# Patient Record
Sex: Male | Born: 1945
Health system: Southern US, Community
[De-identification: ages and names within clinical notes are randomized; demographics above are authoritative.]

## PROBLEM LIST (undated history)

## (undated) DIAGNOSIS — M1990 Unspecified osteoarthritis, unspecified site: Secondary | ICD-10-CM

## (undated) DIAGNOSIS — E119 Type 2 diabetes mellitus without complications: Secondary | ICD-10-CM

## (undated) DIAGNOSIS — E785 Hyperlipidemia, unspecified: Secondary | ICD-10-CM

## (undated) DIAGNOSIS — F419 Anxiety disorder, unspecified: Secondary | ICD-10-CM

## (undated) DIAGNOSIS — K219 Gastro-esophageal reflux disease without esophagitis: Secondary | ICD-10-CM

## (undated) HISTORY — PX: CHOLECYSTECTOMY: SHX55

## (undated) HISTORY — PX: HYDROCELE EXCISION: SHX482

## (undated) HISTORY — DX: Hyperlipidemia, unspecified: E78.5

## (undated) HISTORY — DX: Anxiety disorder, unspecified: F41.9

## (undated) HISTORY — PX: SHOULDER SURGERY: SHX246

## (undated) HISTORY — DX: Gastro-esophageal reflux disease without esophagitis: K21.9

---

## 1998-02-12 ENCOUNTER — Other Ambulatory Visit: Admission: RE | Admit: 1998-02-12 | Discharge: 1998-02-12 | Payer: Self-pay | Admitting: Gastroenterology

## 1999-07-01 ENCOUNTER — Encounter: Payer: Self-pay | Admitting: Family Medicine

## 1999-07-01 ENCOUNTER — Encounter: Admission: RE | Admit: 1999-07-01 | Discharge: 1999-07-01 | Payer: Self-pay | Admitting: Family Medicine

## 2000-05-15 ENCOUNTER — Emergency Department (HOSPITAL_COMMUNITY): Admission: EM | Admit: 2000-05-15 | Discharge: 2000-05-15 | Payer: Self-pay | Admitting: Emergency Medicine

## 2002-04-29 ENCOUNTER — Encounter: Payer: Self-pay | Admitting: Emergency Medicine

## 2002-04-29 ENCOUNTER — Emergency Department (HOSPITAL_COMMUNITY): Admission: EM | Admit: 2002-04-29 | Discharge: 2002-04-29 | Payer: Self-pay | Admitting: Emergency Medicine

## 2002-10-19 ENCOUNTER — Encounter (HOSPITAL_BASED_OUTPATIENT_CLINIC_OR_DEPARTMENT_OTHER): Payer: Self-pay | Admitting: General Surgery

## 2002-10-23 ENCOUNTER — Encounter (INDEPENDENT_AMBULATORY_CARE_PROVIDER_SITE_OTHER): Payer: Self-pay | Admitting: Specialist

## 2002-10-23 ENCOUNTER — Ambulatory Visit (HOSPITAL_COMMUNITY): Admission: RE | Admit: 2002-10-23 | Discharge: 2002-10-24 | Payer: Self-pay | Admitting: General Surgery

## 2002-10-23 ENCOUNTER — Encounter (HOSPITAL_BASED_OUTPATIENT_CLINIC_OR_DEPARTMENT_OTHER): Payer: Self-pay | Admitting: General Surgery

## 2005-12-30 ENCOUNTER — Encounter: Admission: RE | Admit: 2005-12-30 | Discharge: 2005-12-30 | Payer: Self-pay | Admitting: Family Medicine

## 2006-07-13 HISTORY — PX: CHOLECYSTECTOMY: SHX55

## 2006-08-30 ENCOUNTER — Ambulatory Visit: Payer: Self-pay | Admitting: Gastroenterology

## 2006-09-13 ENCOUNTER — Ambulatory Visit: Payer: Self-pay | Admitting: Gastroenterology

## 2006-09-13 ENCOUNTER — Encounter: Payer: Self-pay | Admitting: Gastroenterology

## 2007-10-27 ENCOUNTER — Ambulatory Visit (HOSPITAL_BASED_OUTPATIENT_CLINIC_OR_DEPARTMENT_OTHER): Admission: RE | Admit: 2007-10-27 | Discharge: 2007-10-28 | Payer: Self-pay | Admitting: Orthopedic Surgery

## 2009-11-25 ENCOUNTER — Ambulatory Visit (HOSPITAL_BASED_OUTPATIENT_CLINIC_OR_DEPARTMENT_OTHER): Admission: RE | Admit: 2009-11-25 | Discharge: 2009-11-25 | Payer: Self-pay | Admitting: Urology

## 2010-09-29 LAB — POCT I-STAT 4, (NA,K, GLUC, HGB,HCT)
Glucose, Bld: 128 mg/dL — ABNORMAL HIGH (ref 70–99)
HCT: 45 % (ref 39.0–52.0)
Hemoglobin: 15.3 g/dL (ref 13.0–17.0)
Potassium: 5 mEq/L (ref 3.5–5.1)
Sodium: 140 mEq/L (ref 135–145)

## 2010-09-29 LAB — GLUCOSE, CAPILLARY: Glucose-Capillary: 111 mg/dL — ABNORMAL HIGH (ref 70–99)

## 2010-11-25 NOTE — Op Note (Signed)
NAMEJOVONTE, COMMINS NO.:  [id number]   MEDICAL RECORD NO.:  [id number]          PATIENT TYPE:  AMB   LOCATION:  DSC                          FACILITY:  [facility]   PHYSICIAN:  Loreta Ave, M.D. DATE OF BIRTH:  Feb 25, 1946   DATE OF PROCEDURE:  10/27/2007  DATE OF DISCHARGE:                               OPERATIVE REPORT   PREOPERATIVE DIAGNOSES:  1. Right shoulder large retracted rotator cuff tear.  2. Impingement.  3. Labral tear.  4. Degenerative joint disease, acromioclavicular joint.   POSTOPERATIVE DIAGNOSES:  1. Right shoulder large retracted rotator cuff tear.  2. Impingement.  3. Labral tear.  4. Degenerative joint disease, acromioclavicular joint.  5. Extensive retracted supra and infraspinatus tear with interval      tearing.  6. Loose body partially tethered to torn cuff.   PROCEDURE:  1. Right shoulder exam under anesthesia.  2. Arthroscopy with debridement of glenohumeral joint including      labrum, loose body, biceps tendon cuff.  3. Bursectomy, acromioplasty, and coracoacromial ligament release.  4. Excision, distal clavicle.  Mini open rotator cuff repair with      FiberWire suture, Concept repair system and closure of interval      tear with FiberWire.   SURGEON:  Loreta Ave, MD.   ASSISTANT:  Genene Churn. Barry Dienes, [place], present throughout the entire case  necessary for timely completion of procedure.   ANESTHESIA:  General.   BLOOD LOSS:  Minimal.   SPECIMENS:  None.   CULTURES:  None.   COMPLICATIONS:  None.   DRESSING:  Soft compression with shoulder immobilizer.   PROCEDURE:  The patient was brought to the operating room and placed on  the operating table in supine position.  After adequate anesthesia had  been obtained, right shoulder was examined.  Moderate stiffness, but  full motion, no adhesions.  Placed in a beach chair position on the  shoulder positioner, prepped and draped in usual sterile fashion.  Three  portals; anterior, posterior, and lateral.  Shoulder entered with blunt  obturator, arthroscope introduced, shoulder distended and inspected.  Some grade 2 changes in the shoulder debrided.  Extensive  circumferential labral tears debrided.  Biceps tendon, biceps anchor  with some tendinosis, but intact and functional.  Debrided.  Cuff,  massive tear, entire supraspinatus, half of the infraspinatus with  interval tearing retracted, but still mobile.  Loose body partially at  the shoulder, partially tethered to the cuff, removed.  Cuff debrided  and assessed and felt to be  ruffled from above, type 3 acromion  impingement.  Acromioplasty to a type 1 acromion with shaver and high-  speed burr releasing CA ligament and cautery.  Distal clavicle, grade 4  changes with spurs.  We took out the spurs and lateral centimeter of  clavicle resected.  Adequacy of decompression confirmed viewing from all  portals.  Instruments and fluid removed.  Deltoid splitting incision  through the lateral portal.  Subacromial space accessed.  Adequacy of  acromioplasty confirmed.  Wound irrigated.  Cuff mobilized.  Tuberosity  brought down to bleeding bone  with a bur.  Cuff was then captured with  weave two FiberWire sutures, anterior of which was used to do a running  closure of the interval tear.  With that, the cuff was out laterally  fairly easily.  Series of drill holes in the tuberosity brought out  laterally with the Concept repair system and the sutures viewed through  these.  Arm was abducted, sutures tied over bony bridge.  Nice, firm,  watertight closure of the cuff without undue tension even with full  passive motion.  Wound irrigated.  Deltoid closed with Vicryl, skin with  subcutaneous subcuticular Vicryl, and portals with nylon.  Sterile  compressive dressing applied.  Shoulder immobilizer applied.  Anesthesia  reversed.  Brought to recovery room.  Tolerated the surgery well.  No   complications.      Loreta Ave, M.D.  Electronically Signed     DFM/MEDQ  D:  10/27/2007  T:  10/28/2007  Job:  [id number]

## 2010-11-28 NOTE — Op Note (Signed)
NAME:  Luke Alvarez, FULL NO.:  [id number]   MEDICAL RECORD NO.:  [id number]                   PATIENT TYPE:  OIB   LOCATION:  2550                                 FACILITY:  [facility]   PHYSICIAN:  Leonie Man, M.D.                DATE OF BIRTH:  05/29/1946   DATE OF PROCEDURE:  10/23/2002  DATE OF DISCHARGE:                                 OPERATIVE REPORT   PREOPERATIVE DIAGNOSIS:  Chronic calculous cholecystitis.   POSTOPERATIVE DIAGNOSIS:  Chronic calculous cholecystitis.   PROCEDURE:  Laparoscopic cholecystectomy, intraoperative cholangiogram.   SURGEON:  Leonie Man, M.D.   ASSISTANT:  Joanne Gavel, M.D.   ANESTHESIA:  General.   CLINICAL NOTE:  The patient is a 65 year old man presenting with symptomatic  cholelithiasis, having had some epigastric pain, nausea or vomiting, two  severe attacks, causing his evaluation in the ER on two separate occasions.  He has some bloating after meals, no increased flatulence.  Gallbladder  ultrasound shows a large solitary gallstone on 09/12/02.  LFTs and amylase are  within normal limits.  The patient comes to the operating room now after the  risks, potential benefits, as well as the indications for surgery have been  fully discussed, all questions answered, and consent obtained.   DESCRIPTION OF PROCEDURE:  Following the induction of satisfactory general  anesthesia, the patient is positioned supinely.  The abdomen is routinely  prepped and draped to be included in a sterile operative field.  Open  laparoscopy is created at the umbilicus with the correction and preparation  of an umbilical hernia, which was then dissected free from the surrounding  soft tissues with the sac and hernial contents being resected and forwarded  for pathologic evaluation.  The Hasson cannula was put in place and  insufflation of the peritoneal cavity was carried out to 14-15 mmHg pressure  using carbon dioxide.  The camera  is inserted, visual exploration of the  abdomen is carried out.  The patient has a capacious abdomen with rather  large amounts of omental fat.  None of the large or small intestine viewed  appeared to be abnormal.  The gallbladder was chronically scarred, liver  edges were sharp, liver surfaces smooth.  Anterior gastric wall and duodenal  sweep appeared to be normal.  Under direct vision epigastric and lateral  ports were placed.  The gallbladder was grasped and retracted cephalad.  The  patient was placed in the reverse Trendelenburg position.  Dissection was  then carried down to the region of the ampulla with isolation of the cystic  artery and the cystic duct.  The cystic artery was triply clipped and  transected, and the cystic duct was clipped proximally and opened.  A cystic  duct cholangiogram was carried out by passing a Reddick catheter into the  abdomen through a 14-gauge Angiocath and then inserting the Reddick catheter  into the cystic  duct.  A fluoroscopic cholangiogram was carried out with one-  half strength Hypaque.  The resulting cholangiogram showed free flow of  contrast to the duodenum, normal tapering of the distal common bile duct  with flow of contrast into the upper hepatic radicles.  There were three  filling defects within the common bile duct, which appeared to me to be  bubbles.   At the end of the cholangiogram, the catheter was removed and the cystic  duct was triply clamped distally and transected.  The gallbladder was then  dissected free from the liver bed using electrocautery and maintaining  hemostasis throughout the course of the dissection.  A small perforation was  made in the gallbladder during this course, and some bile was poured out.  This was easily sucked up and the right upper quadrant irrigated.  The  camera was then placed in the epigastric port after the gallbladder had been  placed in an EndoCatch.  The EndoCatch bag was retrieved through  the  umbilicus without difficulty.  Sponge, instrument, and sharp counts were  then verified and the pneumoperitoneum deflated and the wounds closed in  layers as follows after the trocars had been removed from the abdomen.  The  umbilical hernia was then repaired primarily with interrupted sutures of 0  Novofil.  The umbilical skin was then closed with a running suture of 4-0  Monocryl.  The flank wounds and the epigastric wound were closed with 4-0  Monocryl sutures, and all wounds were then reinforced with Steri-Strips.  Sterile dressings were applied, the anesthetic reversed, and the patient  removed from the operating room to the recovery room in stable condition.  He tolerated the procedure well.                                               Leonie Man, M.D.    PB/MEDQ  D:  10/23/2002  T:  10/23/2002  Job:  [id number]

## 2011-04-07 LAB — BASIC METABOLIC PANEL
CO2: 28
Chloride: 105
GFR calc Af Amer: >60
Sodium: 142

## 2011-04-07 LAB — POCT HEMOGLOBIN-HEMACUE: Hemoglobin: 14

## 2014-10-04 DIAGNOSIS — N183 Chronic kidney disease, stage 3 (moderate): Secondary | ICD-10-CM | POA: Diagnosis not present

## 2014-10-04 DIAGNOSIS — N529 Male erectile dysfunction, unspecified: Secondary | ICD-10-CM | POA: Diagnosis not present

## 2014-10-04 DIAGNOSIS — E119 Type 2 diabetes mellitus without complications: Secondary | ICD-10-CM | POA: Diagnosis not present

## 2014-10-04 DIAGNOSIS — Z23 Encounter for immunization: Secondary | ICD-10-CM | POA: Diagnosis not present

## 2014-10-04 DIAGNOSIS — F411 Generalized anxiety disorder: Secondary | ICD-10-CM | POA: Diagnosis not present

## 2014-10-04 DIAGNOSIS — E780 Pure hypercholesterolemia: Secondary | ICD-10-CM | POA: Diagnosis not present

## 2014-10-04 DIAGNOSIS — R030 Elevated blood-pressure reading, without diagnosis of hypertension: Secondary | ICD-10-CM | POA: Diagnosis not present

## 2015-01-10 DIAGNOSIS — E119 Type 2 diabetes mellitus without complications: Secondary | ICD-10-CM | POA: Diagnosis not present

## 2015-04-08 DIAGNOSIS — E119 Type 2 diabetes mellitus without complications: Secondary | ICD-10-CM | POA: Diagnosis not present

## 2015-04-08 DIAGNOSIS — F411 Generalized anxiety disorder: Secondary | ICD-10-CM | POA: Diagnosis not present

## 2015-04-08 DIAGNOSIS — Z23 Encounter for immunization: Secondary | ICD-10-CM | POA: Diagnosis not present

## 2015-04-08 DIAGNOSIS — N183 Chronic kidney disease, stage 3 (moderate): Secondary | ICD-10-CM | POA: Diagnosis not present

## 2015-04-08 DIAGNOSIS — R030 Elevated blood-pressure reading, without diagnosis of hypertension: Secondary | ICD-10-CM | POA: Diagnosis not present

## 2015-04-08 DIAGNOSIS — Z1389 Encounter for screening for other disorder: Secondary | ICD-10-CM | POA: Diagnosis not present

## 2015-04-08 DIAGNOSIS — N529 Male erectile dysfunction, unspecified: Secondary | ICD-10-CM | POA: Diagnosis not present

## 2015-04-08 DIAGNOSIS — E780 Pure hypercholesterolemia: Secondary | ICD-10-CM | POA: Diagnosis not present

## 2015-05-06 ENCOUNTER — Encounter (HOSPITAL_COMMUNITY): Payer: Self-pay | Admitting: *Deleted

## 2015-05-06 ENCOUNTER — Emergency Department (INDEPENDENT_AMBULATORY_CARE_PROVIDER_SITE_OTHER)
Admission: EM | Admit: 2015-05-06 | Discharge: 2015-05-06 | Disposition: A | Discharge status: Home / Self Care | Payer: Self-pay | Source: Home / Self Care | Attending: Family Medicine | Admitting: Family Medicine

## 2015-05-06 ENCOUNTER — Emergency Department (INDEPENDENT_AMBULATORY_CARE_PROVIDER_SITE_OTHER): Payer: Self-pay

## 2015-05-06 DIAGNOSIS — S61012A Laceration without foreign body of left thumb without damage to nail, initial encounter: Secondary | ICD-10-CM

## 2015-05-06 HISTORY — DX: Type 2 diabetes mellitus without complications: E11.9
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
## 2015-05-06 NOTE — Discharge Instructions (Signed)
Leave bandaged until wed, then clean and bandage daily until healed. Return as needed.

## 2015-08-06 DIAGNOSIS — R972 Elevated prostate specific antigen [PSA]: Secondary | ICD-10-CM | POA: Diagnosis not present

## 2015-09-10 ENCOUNTER — Encounter (HOSPITAL_COMMUNITY): Payer: Self-pay | Admitting: *Deleted

## 2015-09-10 ENCOUNTER — Emergency Department (HOSPITAL_COMMUNITY)
Admission: EM | Admit: 2015-09-10 | Discharge: 2015-09-10 | Disposition: A | Discharge status: Home / Self Care | Payer: Commercial Managed Care - HMO | Attending: Emergency Medicine | Admitting: Emergency Medicine

## 2015-09-10 DIAGNOSIS — Z7984 Long term (current) use of oral hypoglycemic drugs: Secondary | ICD-10-CM | POA: Diagnosis not present

## 2015-09-10 DIAGNOSIS — E860 Dehydration: Secondary | ICD-10-CM | POA: Diagnosis not present

## 2015-09-10 DIAGNOSIS — R55 Syncope and collapse: Secondary | ICD-10-CM | POA: Diagnosis not present

## 2015-09-10 DIAGNOSIS — E119 Type 2 diabetes mellitus without complications: Secondary | ICD-10-CM | POA: Insufficient documentation

## 2015-09-10 DIAGNOSIS — R112 Nausea with vomiting, unspecified: Secondary | ICD-10-CM | POA: Insufficient documentation

## 2015-09-10 DIAGNOSIS — Z7982 Long term (current) use of aspirin: Secondary | ICD-10-CM | POA: Diagnosis not present

## 2015-09-10 DIAGNOSIS — Z79899 Other long term (current) drug therapy: Secondary | ICD-10-CM | POA: Diagnosis not present

## 2015-09-10 DIAGNOSIS — R404 Transient alteration of awareness: Secondary | ICD-10-CM | POA: Diagnosis not present

## 2015-09-10 LAB — URINE MICROSCOPIC-ADD ON
BACTERIA UA: NONE SEEN
RBC / HPF: NONE SEEN RBC/hpf (ref 0–5)
SQUAMOUS EPITHELIAL / LPF: NONE SEEN
WBC, UA: NONE SEEN WBC/hpf (ref 0–5)

## 2015-09-10 LAB — BASIC METABOLIC PANEL
Anion gap: 10 (ref 5–15)
BUN: 21 mg/dL — AB (ref 6–20)
CHLORIDE: 107 mmol/L (ref 101–111)
CO2: 22 mmol/L (ref 22–32)
CREATININE: 1.48 mg/dL — AB (ref 0.61–1.24)
Calcium: 8.5 mg/dL — ABNORMAL LOW (ref 8.9–10.3)
GFR calc Af Amer: 54 mL/min — ABNORMAL LOW (ref >60)
GFR calc non Af Amer: 47 mL/min — ABNORMAL LOW (ref >60)
Glucose, Bld: 155 mg/dL — ABNORMAL HIGH (ref 65–99)
Potassium: 4.3 mmol/L (ref 3.5–5.1)
SODIUM: 139 mmol/L (ref 135–145)

## 2015-09-10 LAB — CBC
HCT: 49.4 % (ref 39.0–52.0)
Hemoglobin: 16.3 g/dL (ref 13.0–17.0)
MCH: 29.5 pg (ref 26.0–34.0)
MCHC: 33 g/dL (ref 30.0–36.0)
MCV: 89.3 fL (ref 78.0–100.0)
Platelets: 170 10*3/uL (ref 150–400)
RBC: 5.53 MIL/uL (ref 4.22–5.81)
RDW: 13.4 % (ref 11.5–15.5)
WBC: 9 10*3/uL (ref 4.0–10.5)

## 2015-09-10 LAB — I-STAT TROPONIN, ED: TROPONIN I, POC: 0.01 ng/mL (ref 0.00–0.08)

## 2015-09-10 LAB — URINALYSIS, ROUTINE W REFLEX MICROSCOPIC
BILIRUBIN URINE: NEGATIVE
Glucose, UA: >1000 mg/dL — AB
HGB URINE DIPSTICK: NEGATIVE
KETONES UR: NEGATIVE mg/dL
Leukocytes, UA: NEGATIVE
NITRITE: NEGATIVE
Protein, ur: NEGATIVE mg/dL
SPECIFIC GRAVITY, URINE: 1.038 — AB (ref 1.005–1.030)
pH: 6 (ref 5.0–8.0)

## 2015-09-10 LAB — CBG MONITORING, ED: Glucose-Capillary: 136 mg/dL — ABNORMAL HIGH (ref 65–99)

## 2015-09-10 MED ORDER — SODIUM CHLORIDE 0.9 % IV BOLUS (SEPSIS)
1000.0000 mL | Freq: Once | INTRAVENOUS | Status: AC
  Administered 2015-09-10: 1000 mL via INTRAVENOUS

## 2015-09-10 MED ORDER — ONDANSETRON 4 MG PO TBDP: 4.0000 mg | ORAL_TABLET | Freq: Three times a day (TID) | ORAL | Status: DC | PRN

## 2015-09-10 NOTE — ED Provider Notes (Signed)
CSN: [id number]     Arrival date & time 09/10/15  1206 History   First MD Initiated Contact with Patient 09/10/15 1413     Chief Complaint  Patient presents with  . Loss of Consciousness  . Weakness  . Emesis     HPI   Luke Alvarez is an 70 y.o. male with history of DM who presents to the ED for evaluation of N/V and syncope. Pt reports this morning around 3 AM he woke up feeling very nauseated and had several consecutive episodes of NBNB emesis. Denies hematemsis. States initially he did have some sharp cramping abdominal pain but states felt queasy and bloated. States in the ED now his nausea has resolved. Denies diarrhea. He states all morning he continued to feel weak and run-down. States around 11 AM he was standing in the bathroom shaving when he suddenly got lightheaded. He states he went to sit down but on his way felt like he was going to pass out. He states he remembers slumping against the wall, sliding down, and falling to the floor. He states he thinks he was "out" for a few seconds and then regained consciousness on his own. Denies chest pain, SOB, diaphoresis. States in the ED now he feels tired and weak, though the nausea has passed. Denies any pain at this time. He states he does not think he was in any pain at the time of his syncopal episode. States he has never passed out before.   Past Medical History  Diagnosis Date  . Diabetes mellitus without complication [facility])    Past Surgical History  Procedure Laterality Date  . Cholecystectomy    . Shoulder surgery     No family history on file. Social History  Substance Use Topics  . Smoking status: Never Smoker   . Smokeless tobacco: None  . Alcohol Use: Yes    Review of Systems  All other systems reviewed and are negative.     Allergies  Review of patient's allergies indicates no known allergies.  Home Medications   Prior to Admission medications   Medication Sig Start Date End Date Taking? Authorizing Provider   aspirin EC 81 MG tablet Take 81 mg by mouth daily.   Yes Historical Provider, MD  canagliflozin (INVOKANA) 100 MG TABS tablet Take 100 mg by mouth daily before breakfast.   Yes Historical Provider, MD  glimepiride (AMARYL) 2 MG tablet Take 2 mg by mouth daily with breakfast.   Yes Historical Provider, MD  Multiple Vitamin (MULTIVITAMIN WITH MINERALS) TABS tablet Take 1 tablet by mouth daily.   Yes Historical Provider, MD  naproxen sodium (ANAPROX) 220 MG tablet Take 220 mg by mouth every 12 (twelve) hours as needed (for pain).   Yes Historical Provider, MD  Nutritional Supplements (L-GLUTAMINE/CHOLINE/INOSITOL PO) Take 1 tablet by mouth daily.   Yes Historical Provider, MD  Omega-3 Fatty Acids (FISH OIL) 1000 MG CAPS Take 1 capsule by mouth 2 (two) times daily.   Yes Historical Provider, MD  omeprazole (PRILOSEC) 20 MG capsule Take 20 mg by mouth daily.   Yes Historical Provider, MD  pioglitazone (ACTOS) 30 MG tablet Take 30 mg by mouth daily.   Yes Historical Provider, MD  simvastatin (ZOCOR) 40 MG tablet Take 40 mg by mouth daily.   Yes Historical Provider, MD   BP 122/85 mmHg  Pulse 99  Temp(Src) 98.5 F (36.9 C) (Oral)  Resp 20  SpO2 94% Physical Exam  Constitutional: He is oriented to person, place,  and time.  HENT:  Right Ear: External ear normal.  Left Ear: External ear normal.  Nose: Nose normal.  Mouth/Throat: Oropharynx is clear and moist. Mucous membranes are dry. No oropharyngeal exudate.  Eyes: Conjunctivae and EOM are normal. Pupils are equal, round, and reactive to light.  Neck: Normal range of motion. Neck supple.  Cardiovascular: Normal rate, regular rhythm, normal heart sounds and intact distal pulses.   Pulmonary/Chest: Effort normal and breath sounds normal. No respiratory distress. He has no wheezes. He exhibits no tenderness.  Abdominal: Soft. Bowel sounds are normal. He exhibits no distension. There is no tenderness. There is no rebound and no guarding.   Musculoskeletal: He exhibits no edema.  Neurological: He is alert and oriented to person, place, and time. He has normal strength. No cranial nerve deficit or sensory deficit. Coordination and gait normal.  Skin: Skin is warm and dry.  Psychiatric: He has a normal mood and affect.  Nursing note and vitals reviewed.  Filed Vitals:   09/10/15 1236 09/10/15 1440 09/10/15 1518 09/10/15 1613  BP:  124/92 121/76 131/84  Pulse:  99 99 97  Temp:      TempSrc:      Resp:  18 20 18   SpO2: 94% 92% 92% 94%   Orthostatic VS for the past 24 hrs:  BP- Lying Pulse- Lying BP- Sitting Pulse- Sitting BP- Standing at 0 minutes Pulse- Standing at 0 minutes  09/10/15 1409 122/80 mmHg 98 102/72 mmHg 101 121/69 mmHg 102       ED Course  Procedures (including critical care time) Labs Review Labs Reviewed  BASIC METABOLIC PANEL - Abnormal; Notable for the following:    Glucose, Bld 155 (*)    BUN 21 (*)    Creatinine, Ser 1.48 (*)    Calcium 8.5 (*)    GFR calc non Af Amer 47 (*)    GFR calc Af Amer 54 (*)    All other components within normal limits  URINALYSIS, ROUTINE W REFLEX MICROSCOPIC (NOT AT [facility]) - Abnormal; Notable for the following:    Specific Gravity, Urine 1.038 (*)    Glucose, UA >1000 (*)    All other components within normal limits  CBG MONITORING, ED - Abnormal; Notable for the following:    Glucose-Capillary 136 (*)    All other components within normal limits  CBC  URINE MICROSCOPIC-ADD ON  I-STAT TROPOININ, ED    Imaging Review No results found. I have personally reviewed and evaluated these images and lab results as part of my medical decision-making.   EKG Interpretation   Date/Time:  Tuesday September 10 2015 12:16:05 EST Ventricular Rate:  99 PR Interval:  164 QRS Duration: 90 QT Interval:  342 QTC Calculation: 439 R Axis:   -103 Text Interpretation:  Sinus rhythm Left anterior fascicular block RSR' in  V1 or V2, right VCD or RVH Confirmed by COOK  MD,  BRIAN ([id number]) on  09/10/2015 3:09:08 PM      MDM   Final diagnoses:  Dehydration  Syncope, unspecified syncope type     Pt is an 70 y.o. male with history of DM who presents for evaluation following syncopal episode this AM. I suspect secondary to dehydration and possibly orthostasis. Pt woke this AM with N/V which has since resolved. Suspect viral etiology. Abdominal exam benign with no peritoneal signs. Pt is clinically dry. Urine specific gravity 1.038, BUN 21, Cr 1.48. Trop negative. EKG unchanged from prior. Pt feeling improved after first liter of  IV fluids. Pt seen and evaluated by attending MD Lacinda Axon as well. Pt has ambulated and remained asymptomatic and maintained SpO2 95%. Will give one additional liter of fluids and discharge. Resource guide given for PCP f/u. ER return precautions given.   Anne Ng, PA-C 09/10/15 1625  Nat Christen, MD 09/11/15 [phone]

## 2015-09-10 NOTE — ED Notes (Signed)
Bed: [id number] Expected date:  Expected time:  Means of arrival:  Comments: EMS SYNCOPE

## 2015-09-10 NOTE — Discharge Instructions (Signed)
You were seen in the ER today for evaluation after a syncopal episode (fainting). Your labs and exam suggest dehydration, which I suspect is why you fainted earlier today. Your symptoms improved with IV fluids in the emergency room. Drink plenty of water to stay hydrated. I will give you a prescription for nausea medication to take as needed. I suspect you have a viral GI bug that caused your nausea and vomiting earlier today.  Take medications as prescribed. Return to the emergency room for worsening condition or new concerning symptoms. Follow up with your regular doctor. If you don't have a regular doctor use one of the numbers below to establish a primary care doctor.   Emergency Department Resource Guide 1) Find a Doctor and Pay Out of Pocket Although you won't have to find out who is covered by your insurance plan, it is a good idea to ask around and get recommendations. You will then need to call the office and see if the doctor you have chosen will accept you as a new patient and what types of options they offer for patients who are self-pay. Some doctors offer discounts or will set up payment plans for their patients who do not have insurance, but you will need to ask so you aren't surprised when you get to your appointment.  2) Contact Your Local Health Department Not all health departments have doctors that can see patients for sick visits, but many do, so it is worth a call to see if yours does. If you don't know where your local health department is, you can check in your phone book. The CDC also has a tool to help you locate your state's health department, and many state websites also have listings of all of their local health departments.  3) Find a [facility] If your illness is not likely to be very severe or complicated, you may want to try a walk in clinic. These are popping up all over the country in pharmacies, drugstores, and shopping centers. They're usually staffed by nurse  practitioners or physician assistants that have been trained to treat common illnesses and complaints. They're usually fairly quick and inexpensive. However, if you have serious medical issues or chronic medical problems, these are probably not your best option.  No Primary Care Doctor: - Call Health Connect at  [phone] - they can help you locate a primary care doctor that  accepts your insurance, provides certain services, etc. - Physician Referral [phone]  Emergency Department Resource Guide 1) Find a Doctor and Pay Out of Pocket Although you won't have to find out who is covered by your insurance plan, it is a good idea to ask around and get recommendations. You will then need to call the office and see if the doctor you have chosen will accept you as a new patient and what types of options they offer for patients who are self-pay. Some doctors offer discounts or will set up payment plans for their patients who do not have insurance, but you will need to ask so you aren't surprised when you get to your appointment.  2) Contact Your Local Health Department Not all health departments have doctors that can see patients for sick visits, but many do, so it is worth a call to see if yours does. If you don't know where your local health department is, you can check in your phone book. The CDC also has a tool to help you locate your state's health department, and  many state websites also have listings of all of their local health departments.  3) Find a [facility] If your illness is not likely to be very severe or complicated, you may want to try a walk in clinic. These are popping up all over the country in pharmacies, drugstores, and shopping centers. They're usually staffed by nurse practitioners or physician assistants that have been trained to treat common illnesses and complaints. They're usually fairly quick and inexpensive. However, if you have serious medical issues or chronic  medical problems, these are probably not your best option.  No Primary Care Doctor: - Call Health Connect at  [phone] - they can help you locate a primary care doctor that  accepts your insurance, provides certain services, etc. - Physician Referral Service- [phone]  Chronic Pain Problems: Organization         Address  Phone   Notes  [facility]  [phone] Patients need to be referred by their primary care doctor.   Medication Assistance: Organization         Address  Phone   Notes  [facility] Medication [facility] [address] [place], [place] [address] [phone] --Must be a resident of [facility] -- Must have NO insurance coverage whatsoever (no Medicaid/ Medicare, etc.) -- The pt. MUST have a primary care doctor that directs their care regularly and follows them in the community   [facility]  [phone]   [employer]  [phone]    Agencies that provide inexpensive medical care: Organization         Address  Phone   Notes  [facility]  [phone]   [facility]    [phone]   [facility] [place],  [phone]   [place] [address], [place] [phone]   [facility]    [phone]   [facility]    [phone]   [place] and [place] [address], [place] Phone:  [phone], Fax:  [phone] Hours of Operation:  9 am - 6 pm, M-F.  Also accepts Medicaid/Medicare and self-pay.  [facility] for [place] [address], [address], [place] Phone: [phone], Fax: [phone]. Hours of Operation:  8:30 am - 5:30 pm, M-F.  Also accepts Medicaid and self-pay.  [facility] [place] [address] [place] Phone: [phone]   [place], [place], [place] [phone],  Ext. [phone] Mondays & Thursdays: 7-9 AM.  First 15 patients are seen on a first come, first serve basis.    [place] Providers:  Organization         Address  Phone   Notes  [facility] [address], [place] Ramirez [phone] Also accepts self-pay patients.  [facility] [id number], [place]  [phone]   [place], [address], [place] [phone]   [facility] [address] [place] [phone]   Lucianne Lei [address], [place]   [phone] Only accepts [place] patients after they have their name applied to their card.   Self-Pay (no insurance) in [facility]:  Organization  Address  Phone   Notes  Sickle Cell Patients, [facility] [place] [phone]   [facility] [place] [phone]   [facility] [place]  [place], [address], [place] [phone]   [facility]/Dr. Osei-Bonsu  [address] [place] or [address], [place] [phone] Phone number for both [place] and [place] locations is the same.  [facility] and [facility] [address], [place] [phone]   [facility] [address] [place] or [address] [phone]   [facility] [address], [place] [phone], phone; [phone], fax Sees patients 1st and 3rd Saturday of every month.  Must not qualify for public or private insurance (i.e. Medicaid, Medicare, [place] Health Choice, Veterans' Benefits)  Household income should be no more than 200% of the poverty level The clinic cannot treat you if you are pregnant or think you are pregnant  Sexually transmitted diseases are not  treated at the clinic.

## 2015-09-10 NOTE — ED Provider Notes (Signed)
4:26 PM Patient signed out to me by Sam, PA-C.  Plan is for patient to finish fluids and be discharge.  Patient was seen by Dr. Lacinda Axon as well, who advised discharge after fluids.  4:45 PM Patient reassessed by me.  States that he is feeling well and is ready to go home.  No complaints.  Montine Circle, PA-C 09/10/15 1646  Charlesetta Shanks, MD 09/22/15 1451

## 2015-09-10 NOTE — ED Notes (Signed)
Per EMS, pt had syncopal episode at ~11AM today. Wife heard patient lose consciousness, called EMS. Pt states he vomited last night, complains of weakness since this morning. Pt was alert and oriented upon EMS arrival. Pt alert and oriented to person, place, time. Pt given 4mg  zofran en route to hospital. CBG 167 on scene.

## 2015-09-20 DIAGNOSIS — N433 Hydrocele, unspecified: Secondary | ICD-10-CM | POA: Diagnosis not present

## 2015-09-20 DIAGNOSIS — Z0000 Encounter for general adult medical examination without abnormal findings: Secondary | ICD-10-CM | POA: Diagnosis not present

## 2015-09-20 DIAGNOSIS — R972 Elevated prostate specific antigen [PSA]: Secondary | ICD-10-CM | POA: Diagnosis not present

## 2015-10-08 DIAGNOSIS — Z7984 Long term (current) use of oral hypoglycemic drugs: Secondary | ICD-10-CM | POA: Diagnosis not present

## 2015-10-08 DIAGNOSIS — R972 Elevated prostate specific antigen [PSA]: Secondary | ICD-10-CM | POA: Diagnosis not present

## 2015-10-08 DIAGNOSIS — E7800 Pure hypercholesterolemia, unspecified: Secondary | ICD-10-CM | POA: Diagnosis not present

## 2015-10-08 DIAGNOSIS — Z86010 Personal history of colonic polyps: Secondary | ICD-10-CM | POA: Diagnosis not present

## 2015-10-08 DIAGNOSIS — E119 Type 2 diabetes mellitus without complications: Secondary | ICD-10-CM | POA: Diagnosis not present

## 2015-10-08 DIAGNOSIS — R030 Elevated blood-pressure reading, without diagnosis of hypertension: Secondary | ICD-10-CM | POA: Diagnosis not present

## 2015-10-08 DIAGNOSIS — N183 Chronic kidney disease, stage 3 (moderate): Secondary | ICD-10-CM | POA: Diagnosis not present

## 2015-10-08 DIAGNOSIS — N529 Male erectile dysfunction, unspecified: Secondary | ICD-10-CM | POA: Diagnosis not present

## 2015-11-11 ENCOUNTER — Other Ambulatory Visit: Payer: Self-pay | Admitting: Family Medicine

## 2015-11-11 ENCOUNTER — Ambulatory Visit
Admission: RE | Admit: 2015-11-11 | Discharge: 2015-11-11 | Disposition: A | Discharge status: Home / Self Care | Payer: Commercial Managed Care - HMO | Source: Ambulatory Visit | Attending: Family Medicine | Admitting: Family Medicine

## 2015-11-11 DIAGNOSIS — B349 Viral infection, unspecified: Secondary | ICD-10-CM | POA: Diagnosis not present

## 2015-11-11 DIAGNOSIS — R059 Cough, unspecified: Secondary | ICD-10-CM

## 2015-11-11 DIAGNOSIS — R05 Cough: Secondary | ICD-10-CM

## 2016-01-09 DIAGNOSIS — E1165 Type 2 diabetes mellitus with hyperglycemia: Secondary | ICD-10-CM | POA: Diagnosis not present

## 2016-01-09 DIAGNOSIS — Z7984 Long term (current) use of oral hypoglycemic drugs: Secondary | ICD-10-CM | POA: Diagnosis not present

## 2016-01-17 DIAGNOSIS — Z7984 Long term (current) use of oral hypoglycemic drugs: Secondary | ICD-10-CM | POA: Diagnosis not present

## 2016-01-17 DIAGNOSIS — E1165 Type 2 diabetes mellitus with hyperglycemia: Secondary | ICD-10-CM | POA: Diagnosis not present

## 2016-04-20 DIAGNOSIS — Z7984 Long term (current) use of oral hypoglycemic drugs: Secondary | ICD-10-CM | POA: Diagnosis not present

## 2016-04-20 DIAGNOSIS — E1165 Type 2 diabetes mellitus with hyperglycemia: Secondary | ICD-10-CM | POA: Diagnosis not present

## 2016-06-17 DIAGNOSIS — Z0000 Encounter for general adult medical examination without abnormal findings: Secondary | ICD-10-CM | POA: Diagnosis not present

## 2016-06-17 DIAGNOSIS — Z86010 Personal history of colonic polyps: Secondary | ICD-10-CM | POA: Diagnosis not present

## 2016-06-17 DIAGNOSIS — E119 Type 2 diabetes mellitus without complications: Secondary | ICD-10-CM | POA: Diagnosis not present

## 2016-06-17 DIAGNOSIS — N529 Male erectile dysfunction, unspecified: Secondary | ICD-10-CM | POA: Diagnosis not present

## 2016-06-17 DIAGNOSIS — E7800 Pure hypercholesterolemia, unspecified: Secondary | ICD-10-CM | POA: Diagnosis not present

## 2016-06-17 DIAGNOSIS — N183 Chronic kidney disease, stage 3 (moderate): Secondary | ICD-10-CM | POA: Diagnosis not present

## 2016-06-17 DIAGNOSIS — R030 Elevated blood-pressure reading, without diagnosis of hypertension: Secondary | ICD-10-CM | POA: Diagnosis not present

## 2016-07-13 HISTORY — PX: COLONOSCOPY: SHX174

## 2016-08-03 ENCOUNTER — Encounter: Payer: Self-pay | Admitting: Gastroenterology

## 2016-08-17 ENCOUNTER — Encounter: Payer: Self-pay | Admitting: Gastroenterology

## 2016-10-05 ENCOUNTER — Ambulatory Visit (AMBULATORY_SURGERY_CENTER): Payer: Self-pay | Admitting: *Deleted

## 2016-10-05 VITALS — Ht 67.0 in | Wt 219.0 lb

## 2016-10-05 DIAGNOSIS — Z86010 Personal history of colonic polyps: Secondary | ICD-10-CM

## 2016-10-05 MED ORDER — NA SULFATE-K SULFATE-MG SULF 17.5-3.13-1.6 GM/177ML PO SOLN: ORAL | 0 refills | Status: DC

## 2016-10-05 NOTE — Progress Notes (Signed)
Patient denies any allergies to eggs or soy. Patient denies any problems with anesthesia/sedation. Patient denies any oxygen use at home and does not take any diet/weight loss medications.  

## 2016-10-08 ENCOUNTER — Encounter: Payer: Self-pay | Admitting: Gastroenterology

## 2016-10-21 ENCOUNTER — Encounter: Payer: Self-pay | Admitting: Gastroenterology

## 2016-10-21 ENCOUNTER — Ambulatory Visit (AMBULATORY_SURGERY_CENTER): Payer: Medicare HMO | Admitting: Gastroenterology

## 2016-10-21 VITALS — BP 112/72 | HR 58 | Temp 96.0°F | Resp 16 | Ht 67.0 in | Wt 219.0 lb

## 2016-10-21 DIAGNOSIS — D123 Benign neoplasm of transverse colon: Secondary | ICD-10-CM | POA: Diagnosis not present

## 2016-10-21 DIAGNOSIS — Z1212 Encounter for screening for malignant neoplasm of rectum: Secondary | ICD-10-CM | POA: Diagnosis not present

## 2016-10-21 DIAGNOSIS — Z1211 Encounter for screening for malignant neoplasm of colon: Secondary | ICD-10-CM

## 2016-10-21 MED ORDER — SODIUM CHLORIDE 0.9 % IV SOLN: 500.0000 mL | INTRAVENOUS | Status: DC

## 2016-10-21 NOTE — Progress Notes (Signed)
A and O x3. Report to RN. Tolerated MAC anesthesia well.

## 2016-10-21 NOTE — Op Note (Signed)
[facility] Patient Name: [place] Steidle Procedure Date: 10/21/2016 8:28 AM MRN: [id number] Endoscopist: Mallie Mussel L. Loletha Carrow , MD Age: 71 Referring MD:  Date of Birth: 1946/01/24 Gender: Male Account #: [id number] Procedure:                Colonoscopy Indications:              Screening for colorectal malignant neoplasm (no                            adenomatous polyps on 09/2006 colonoscopy) Medicines:                Monitored Anesthesia Care Procedure:                Pre-Anesthesia Assessment:                           - Prior to the procedure, a History and Physical                            was performed, and patient medications and                            allergies were reviewed. The patient's tolerance of                            previous anesthesia was also reviewed. The risks                            and benefits of the procedure and the sedation                            options and risks were discussed with the patient.                            All questions were answered, and informed consent                            was obtained. Anticoagulants: The patient has taken                            aspirin. It was decided not to withhold this                            medication prior to the procedure. ASA Grade                            Assessment: III - A patient with severe systemic                            disease. After reviewing the risks and benefits,                            the patient was deemed in satisfactory condition to  undergo the procedure.                           After obtaining informed consent, the colonoscope                            was passed under direct vision. Throughout the                            procedure, the patient's blood pressure, pulse, and                            oxygen saturations were monitored continuously. The                            Colonoscope was introduced through the anus  and                            advanced to the the cecum, identified by                            appendiceal orifice and ileocecal valve. The                            colonoscopy was performed without difficulty. The                            patient tolerated the procedure well. The quality                            of the bowel preparation was good. Due to technical                            problems, no anatomical structures could be                            photographed. The bowel preparation used was                            SUPREP. The quality of the bowel preparation was                            evaluated using the BBPS [facility] Bowel Preparation                            Scale) with scores of: Right Colon = 2, Transverse                            Colon = 2 and Left Colon = 2. The total BBPS score                            equals 6. Scope In: 8:35:40 AM Scope Out: 8:51:11 AM Scope Withdrawal Time: 0 hours 12 minutes 15 seconds  Total Procedure  Duration: 0 hours 15 minutes 31 seconds  Findings:                 The perianal and digital rectal examinations were                            normal.                           Diverticula were found in the entire colon.                           A 5 mm polyp was found in the distal transverse                            colon. The polyp was sessile. The polyp was removed                            with a cold snare. Resection and retrieval were                            complete.                           The exam was otherwise without abnormality on                            direct and retroflexion views. Complications:            No immediate complications. Estimated Blood Loss:     Estimated blood loss: none. Impression:               - Diverticulosis in the entire examined colon.                           - One 5 mm polyp in the distal transverse colon,                            removed with a cold snare. Resected and  retrieved.                           - The examination was otherwise normal on direct                            and retroflexion views. Recommendation:           - Patient has a contact number available for                            emergencies. The signs and symptoms of potential                            delayed complications were discussed with the                            patient. Return to normal activities tomorrow.  Written discharge instructions were provided to the                            patient.                           - Resume previous diet.                           - Continue present medications.                           - Await pathology results.                           - Repeat colonoscopy is recommended for                            surveillance. The colonoscopy date will be                            determined after pathology results from today's                            exam become available for review. Zarina Pe L. Loletha Carrow, MD 10/21/2016 8:56:48 AM This report has been signed electronically.

## 2016-10-21 NOTE — Progress Notes (Signed)
Called to room to assist during endoscopic procedure.  Patient ID and intended procedure confirmed with present staff. Received instructions for my participation in the procedure from the performing physician.  

## 2016-10-21 NOTE — Patient Instructions (Signed)
Handout given on polyps  YOU HAD AN ENDOSCOPIC PROCEDURE TODAY: Refer to the procedure report and other information in the discharge instructions given to you for any specific questions about what was found during the examination. If this information does not answer your questions, please call [place] office at [phone] to clarify.   YOU SHOULD EXPECT: Some feelings of bloating in the abdomen. Passage of more gas than usual. Walking can help get rid of the air that was put into your GI tract during the procedure and reduce the bloating. If you had a lower endoscopy (such as a colonoscopy or flexible sigmoidoscopy) you may notice spotting of blood in your stool or on the toilet paper. Some abdominal soreness may be present for a day or two, also.  DIET: Your first meal following the procedure should be a light meal and then it is ok to progress to your normal diet. A half-sandwich or bowl of soup is an example of a good first meal. Heavy or fried foods are harder to digest and may make you feel nauseous or bloated. Drink plenty of fluids but you should avoid alcoholic beverages for 24 hours. If you had a esophageal dilation, please see attached instructions for diet.    ACTIVITY: Your care partner should take you home directly after the procedure. You should plan to take it easy, moving slowly for the rest of the day. You can resume normal activity the day after the procedure however YOU SHOULD NOT DRIVE, use power tools, machinery or perform tasks that involve climbing or major physical exertion for 24 hours (because of the sedation medicines used during the test).   SYMPTOMS TO REPORT IMMEDIATELY: A gastroenterologist can be reached at any hour. Please call [phone]  for any of the following symptoms:  Following lower endoscopy (colonoscopy, flexible sigmoidoscopy) Excessive amounts of blood in the stool  Significant tenderness, worsening of abdominal pains  Swelling of the abdomen that is  new, acute  Fever of 100 or higher    FOLLOW UP:  If any biopsies were taken you will be contacted by phone or by letter within the next 1-3 weeks. Call [phone]  if you have not heard about the biopsies in 3 weeks.  Please also call with any specific questions about appointments or follow up tests.  

## 2016-10-22 ENCOUNTER — Telehealth: Payer: Self-pay

## 2016-10-22 NOTE — Telephone Encounter (Signed)
  Follow up Call-  Call back number 10/21/2016  Post procedure Call Back phone  # [phone]  Permission to leave phone message Yes  Some recent data might be hidden     Left message

## 2016-10-22 NOTE — Telephone Encounter (Signed)
Attempted to reach pt. With follow up call following endoscopic procedure yesterday.   LM on pt.'s ans. Machine to call if he had any questions or concerns.

## 2016-10-27 ENCOUNTER — Encounter: Payer: Self-pay | Admitting: Gastroenterology

## 2016-12-16 DIAGNOSIS — Z7984 Long term (current) use of oral hypoglycemic drugs: Secondary | ICD-10-CM | POA: Diagnosis not present

## 2016-12-16 DIAGNOSIS — N529 Male erectile dysfunction, unspecified: Secondary | ICD-10-CM | POA: Diagnosis not present

## 2016-12-16 DIAGNOSIS — N183 Chronic kidney disease, stage 3 (moderate): Secondary | ICD-10-CM | POA: Diagnosis not present

## 2016-12-16 DIAGNOSIS — E7800 Pure hypercholesterolemia, unspecified: Secondary | ICD-10-CM | POA: Diagnosis not present

## 2016-12-16 DIAGNOSIS — E1165 Type 2 diabetes mellitus with hyperglycemia: Secondary | ICD-10-CM | POA: Diagnosis not present

## 2016-12-16 DIAGNOSIS — R030 Elevated blood-pressure reading, without diagnosis of hypertension: Secondary | ICD-10-CM | POA: Diagnosis not present

## 2016-12-21 IMAGING — CR DG CHEST 2V
2 series · 2 of 2 positions shown · non-contrast
Comparison: None.

CLINICAL DATA: Cough.

EXAM:
CHEST  2 VIEW

[w chest pa]
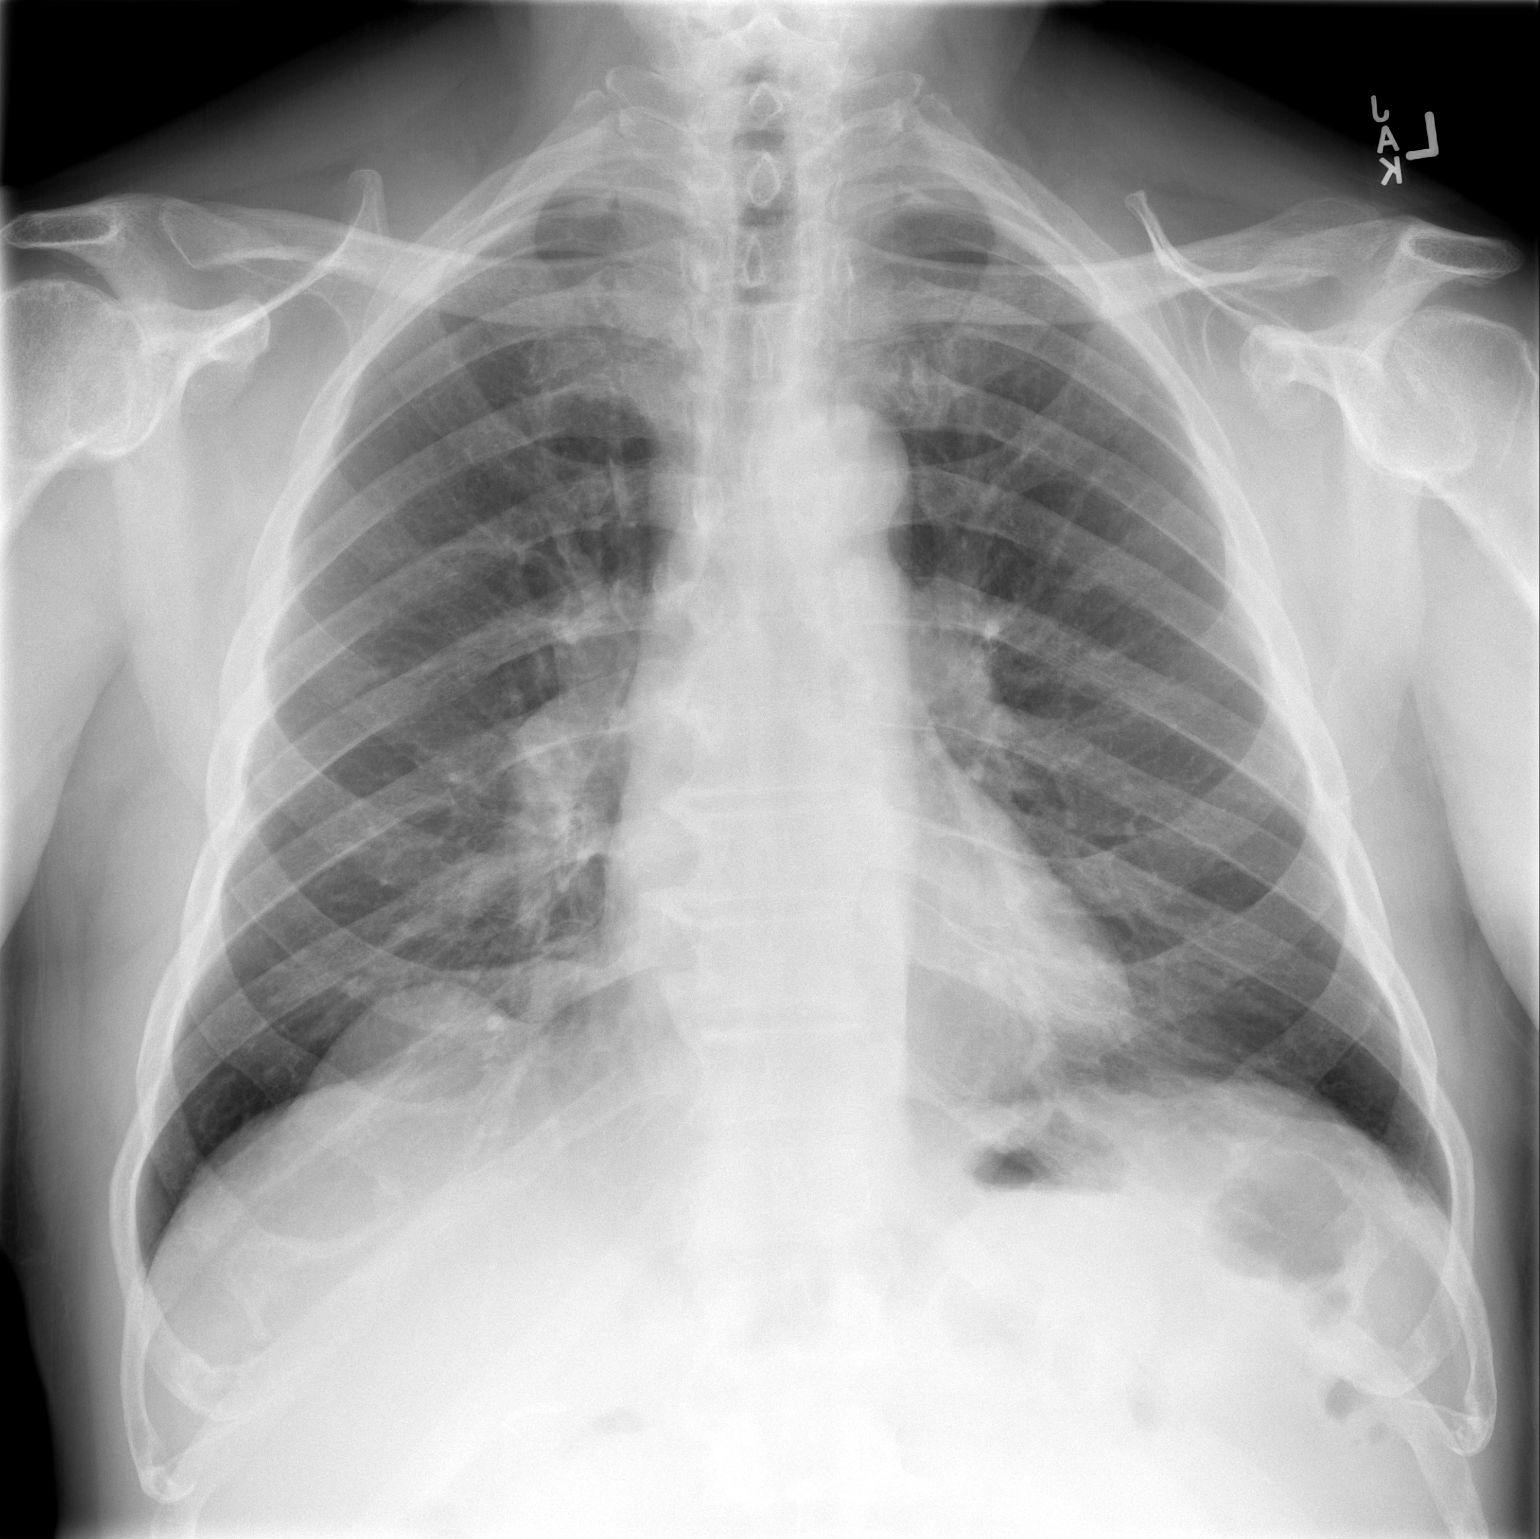

[w chest lat]
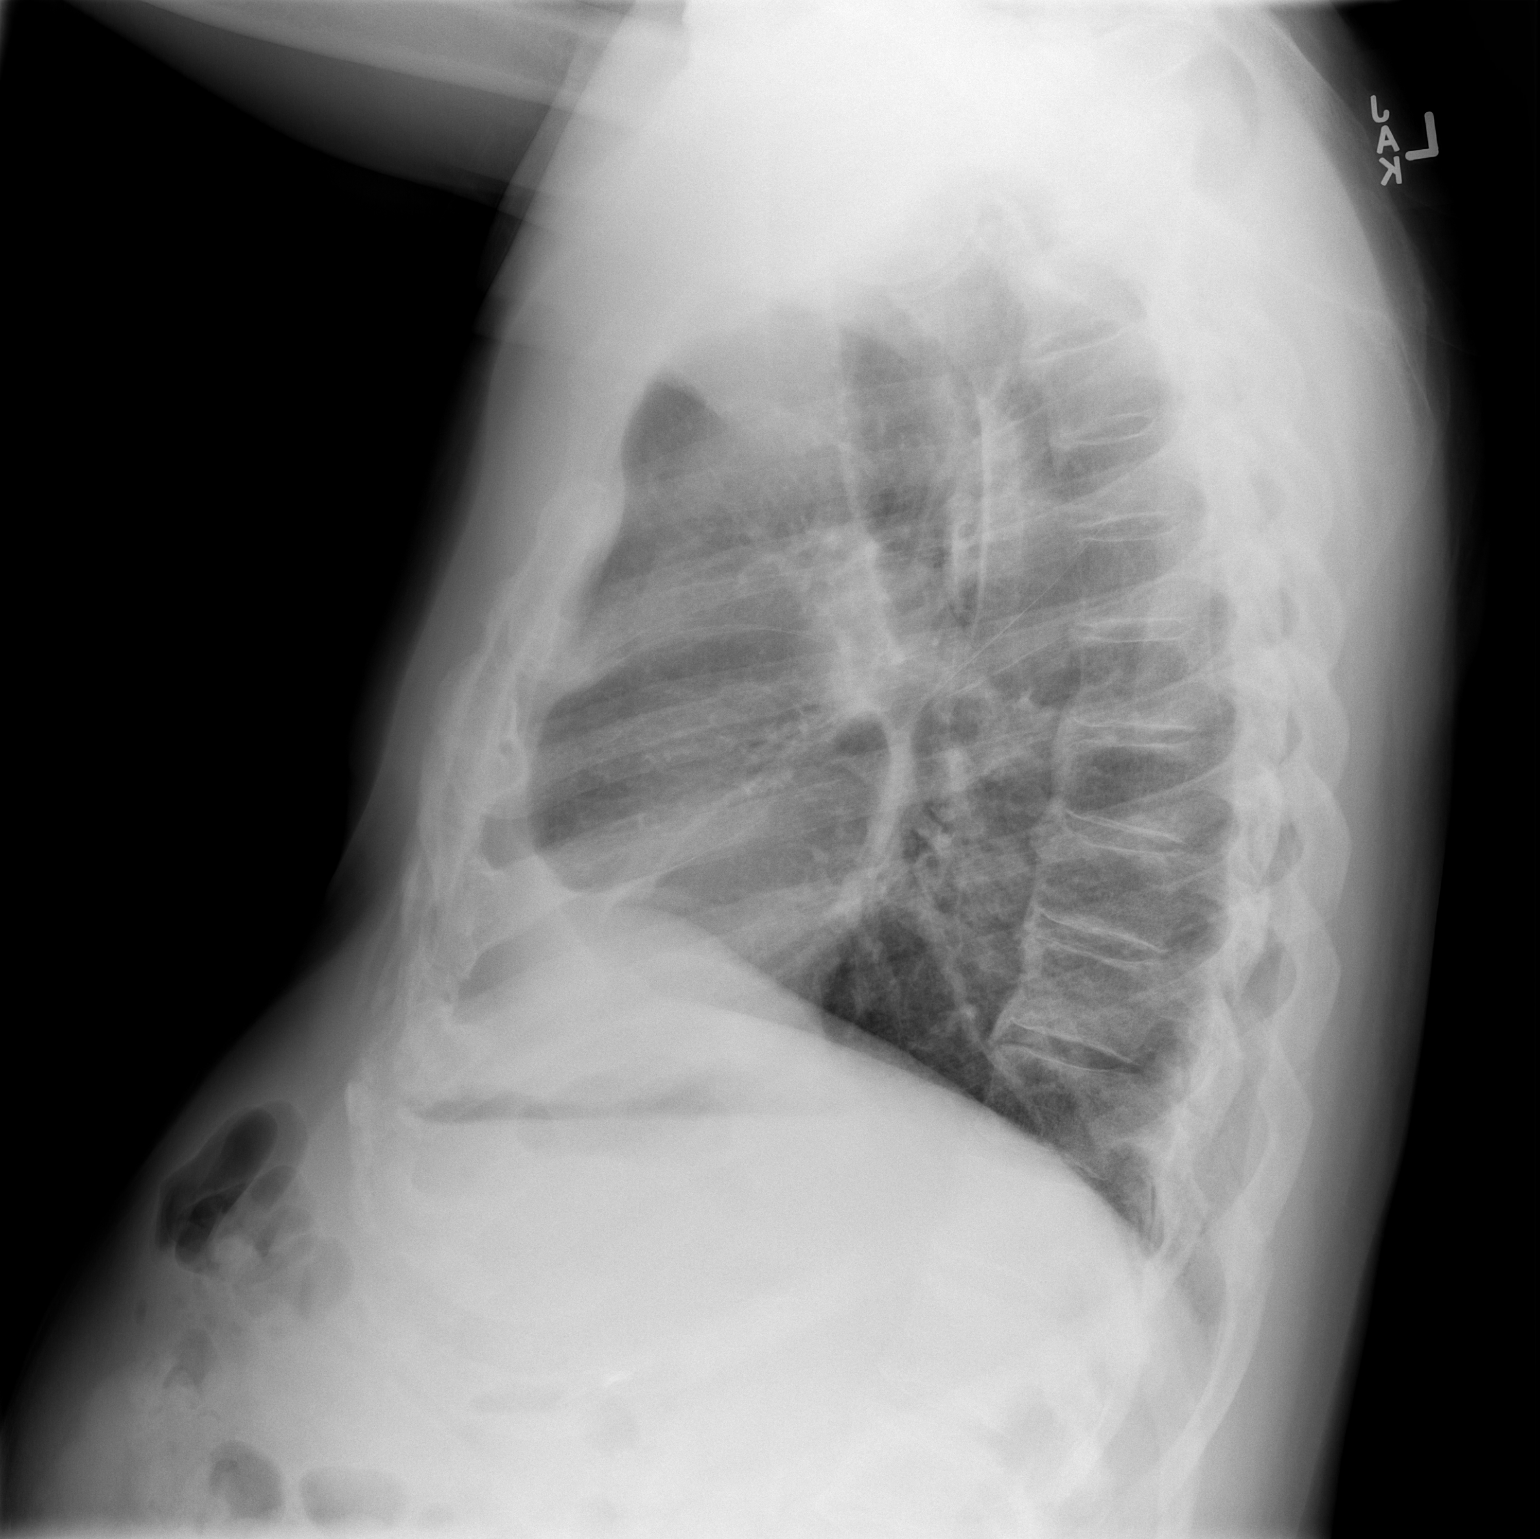

[2 of 2 positions shown; findings below may reference images not displayed]

FINDINGS: The heart size and mediastinal contours are within normal limits.
Both lungs are clear. The visualized skeletal structures are
unremarkable.
IMPRESSION: No active cardiopulmonary disease.

## 2017-06-22 DIAGNOSIS — R030 Elevated blood-pressure reading, without diagnosis of hypertension: Secondary | ICD-10-CM | POA: Diagnosis not present

## 2017-06-22 DIAGNOSIS — E7800 Pure hypercholesterolemia, unspecified: Secondary | ICD-10-CM | POA: Diagnosis not present

## 2017-06-22 DIAGNOSIS — N433 Hydrocele, unspecified: Secondary | ICD-10-CM | POA: Diagnosis not present

## 2017-06-22 DIAGNOSIS — Z0000 Encounter for general adult medical examination without abnormal findings: Secondary | ICD-10-CM | POA: Diagnosis not present

## 2017-06-22 DIAGNOSIS — Z125 Encounter for screening for malignant neoplasm of prostate: Secondary | ICD-10-CM | POA: Diagnosis not present

## 2017-06-22 DIAGNOSIS — Z1159 Encounter for screening for other viral diseases: Secondary | ICD-10-CM | POA: Diagnosis not present

## 2017-06-22 DIAGNOSIS — N529 Male erectile dysfunction, unspecified: Secondary | ICD-10-CM | POA: Diagnosis not present

## 2017-06-22 DIAGNOSIS — E119 Type 2 diabetes mellitus without complications: Secondary | ICD-10-CM | POA: Diagnosis not present

## 2017-06-22 DIAGNOSIS — N183 Chronic kidney disease, stage 3 (moderate): Secondary | ICD-10-CM | POA: Diagnosis not present

## 2017-08-02 DIAGNOSIS — N433 Hydrocele, unspecified: Secondary | ICD-10-CM | POA: Diagnosis not present

## 2017-08-24 ENCOUNTER — Other Ambulatory Visit: Payer: Self-pay

## 2017-08-24 ENCOUNTER — Encounter (HOSPITAL_BASED_OUTPATIENT_CLINIC_OR_DEPARTMENT_OTHER): Payer: Self-pay | Admitting: *Deleted

## 2017-08-24 ENCOUNTER — Other Ambulatory Visit: Payer: Self-pay | Admitting: Urology

## 2017-08-24 NOTE — Progress Notes (Signed)
Npo after midnight, take omeprazole sip of water ina am,  arrive 1000 am 09-07-17 [facility] wife deborah driver, needs ekg, poct, and ekg.

## 2017-08-27 NOTE — Progress Notes (Addendum)
Noted pt surgery has moved to 09-01-2017 to start at 1245.  Called and left voicemail message via pt phone , that his surgery has moved to Wednesday 09-01-2017 to start at 1245, he to arrive at [facility] at 1045 be npo after mn w/ exception sip of water w/ prilosec.  Needs istat and ekg.  Asked pt to call back to confirm he received message .    Also, called [facility] front desk , spoke w/ vicki,  About dated and time change w/ pt arrival time.

## 2017-08-30 NOTE — H&P (Signed)
Urology Preoperative H&P   Chief Complaint: Scrotal enlargement  History of Present Illness: Luke Alvarez is a 72 y.o. male with a history of bilateral hydroceles (s/p left hydrocelectomy in 2011). He presents today with a 1 year history of progressive enlargement of his right sided hydrocele. He states that the hydrocele is affecting his ability to have intercourse and affecting his penile length when urinating (especially when cold, per the patient). He denies interval urinary tract infections including epididymitis/orchitis since he was last evaluated. The right testicle is nonpainful, but just uncomfortable due to a mass effect. A scrotal ultrasound from 2007 was reviewed and showed no parenchymal lesions involving either testicle.    Past Medical History:  Diagnosis Date  . Arthritis   . Diabetes mellitus without complication ([place])    type 2  . GERD (gastroesophageal reflux disease)   . Hyperlipidemia     Past Surgical History:  Procedure Laterality Date  . CHOLECYSTECTOMY    . HYDROCELE EXCISION    . SHOULDER SURGERY Left    rotator cuff    Allergies: No Known Allergies  Family History  Problem Relation Age of Onset  . Colon cancer Neg Hx     Social History:  reports that he has quit smoking. His smoking use included cigarettes. He has a 40.00 pack-year smoking history. he has never used smokeless tobacco. He reports that he drinks alcohol. He reports that he does not use drugs.  ROS: A complete review of systems was performed.  All systems are negative except for pertinent findings as noted.  Physical Exam:  Vital signs in last 24 hours:   Constitutional:  Alert and oriented, No acute distress Cardiovascular: Regular rate and rhythm, No JVD Respiratory: Normal respiratory effort, Lungs clear bilaterally GI: Abdomen is soft, nontender, nondistended, no abdominal masses GU: No CVA tenderness.  Bilateral, non-painful peritesticular fluid collections consistent with  hydroceles.  No testicular masses or lesions.  No signs of infection involving the genitalia.   Lymphatic: No lymphadenopathy Neurologic: Grossly intact, no focal deficits Psychiatric: Normal mood and affect  Laboratory Data:  No results for input(s): WBC, HGB, HCT, PLT in the last 72 hours.  No results for input(s): NA, K, CL, GLUCOSE, BUN, CALCIUM, CREATININE in the last 72 hours.  Invalid input(s): CO3   No results found for this or any previous visit (from the past 24 hour(s)). No results found for this or any previous visit (from the past 240 hour(s)).  Renal Function: No results for input(s): CREATININE in the last 168 hours. CrCl cannot be calculated (Patient's most recent lab result is older than the maximum 21 days allowed.).  Radiologic Imaging: No results found.  I independently reviewed the above imaging studies.  Assessment and Plan [name] is a 72 y.o. male with bilateral hydroceles  -The risks, benefits and alternatives of right hydrocelectomy was discussed with the patient. Risks include, but are not limited to, bleeding, wound infection, recurrence of the hydrocele, chronic testicular pain, testicular loss and the inherent risks of general anesthesia. He voices understanding and wishes to proceed.       Ellison Hughs, MD 08/30/2017, 8:36 PM  [facility] Pager: [phone]

## 2017-09-01 ENCOUNTER — Encounter (HOSPITAL_BASED_OUTPATIENT_CLINIC_OR_DEPARTMENT_OTHER)
Admission: RE | Disposition: A | Discharge status: Home / Self Care | Payer: Self-pay | Source: Ambulatory Visit | Attending: Urology

## 2017-09-01 ENCOUNTER — Ambulatory Visit (HOSPITAL_BASED_OUTPATIENT_CLINIC_OR_DEPARTMENT_OTHER)
Admission: RE | Admit: 2017-09-01 | Discharge: 2017-09-01 | Disposition: A | Discharge status: Home / Self Care | Payer: Medicare HMO | Source: Ambulatory Visit | Attending: Urology | Admitting: Urology

## 2017-09-01 ENCOUNTER — Ambulatory Visit (HOSPITAL_BASED_OUTPATIENT_CLINIC_OR_DEPARTMENT_OTHER): Payer: Medicare HMO | Admitting: Anesthesiology

## 2017-09-01 ENCOUNTER — Other Ambulatory Visit: Payer: Self-pay

## 2017-09-01 ENCOUNTER — Encounter (HOSPITAL_BASED_OUTPATIENT_CLINIC_OR_DEPARTMENT_OTHER): Payer: Self-pay | Admitting: Anesthesiology

## 2017-09-01 DIAGNOSIS — N433 Hydrocele, unspecified: Secondary | ICD-10-CM | POA: Diagnosis not present

## 2017-09-01 DIAGNOSIS — Z87891 Personal history of nicotine dependence: Secondary | ICD-10-CM | POA: Insufficient documentation

## 2017-09-01 DIAGNOSIS — E119 Type 2 diabetes mellitus without complications: Secondary | ICD-10-CM | POA: Diagnosis not present

## 2017-09-01 DIAGNOSIS — Z7982 Long term (current) use of aspirin: Secondary | ICD-10-CM | POA: Diagnosis not present

## 2017-09-01 DIAGNOSIS — Z7984 Long term (current) use of oral hypoglycemic drugs: Secondary | ICD-10-CM | POA: Insufficient documentation

## 2017-09-01 DIAGNOSIS — K219 Gastro-esophageal reflux disease without esophagitis: Secondary | ICD-10-CM | POA: Diagnosis not present

## 2017-09-01 DIAGNOSIS — E785 Hyperlipidemia, unspecified: Secondary | ICD-10-CM | POA: Diagnosis not present

## 2017-09-01 DIAGNOSIS — Z79899 Other long term (current) drug therapy: Secondary | ICD-10-CM | POA: Diagnosis not present

## 2017-09-01 HISTORY — PX: HYDROCELE EXCISION: SHX482

## 2017-09-01 HISTORY — DX: Unspecified osteoarthritis, unspecified site: M19.90

## 2017-09-01 LAB — POCT I-STAT 4, (NA,K, GLUC, HGB,HCT)
Glucose, Bld: 105 mg/dL — ABNORMAL HIGH (ref 65–99)
HCT: 50 % (ref 39.0–52.0)
Hemoglobin: 17 g/dL (ref 13.0–17.0)
POTASSIUM: 4.5 mmol/L (ref 3.5–5.1)
SODIUM: 144 mmol/L (ref 135–145)

## 2017-09-01 LAB — GLUCOSE, CAPILLARY: GLUCOSE-CAPILLARY: 99 mg/dL (ref 65–99)

## 2017-09-01 SURGERY — HYDROCELECTOMY
Anesthesia: General | Laterality: Right

## 2017-09-01 MED ORDER — CEFAZOLIN SODIUM-DEXTROSE 2-4 GM/100ML-% IV SOLN
2.0000 g | Freq: Once | INTRAVENOUS | Status: AC
  Administered 2017-09-01: 2 g via INTRAVENOUS
  Filled 2017-09-01: qty 100

## 2017-09-01 MED ORDER — HYDROCODONE-ACETAMINOPHEN 5-325 MG PO TABS: 1.0000 | ORAL_TABLET | ORAL | 0 refills | Status: DC | PRN

## 2017-09-01 MED ORDER — CEFAZOLIN SODIUM-DEXTROSE 2-4 GM/100ML-% IV SOLN
INTRAVENOUS | Status: AC
  Filled 2017-09-01: qty 100

## 2017-09-01 MED ORDER — ONDANSETRON HCL 4 MG PO TABS: 4.0000 mg | ORAL_TABLET | Freq: Every day | ORAL | 1 refills | Status: AC | PRN

## 2017-09-01 MED ORDER — SUCCINYLCHOLINE CHLORIDE 200 MG/10ML IV SOSY
PREFILLED_SYRINGE | INTRAVENOUS | Status: DC | PRN
  Administered 2017-09-01: 80 mg via INTRAVENOUS

## 2017-09-01 MED ORDER — BUPIVACAINE HCL (PF) 0.25 % IJ SOLN
INTRAMUSCULAR | Status: DC | PRN
  Administered 2017-09-01: 9 mL

## 2017-09-01 MED ORDER — LIDOCAINE 2% (20 MG/ML) 5 ML SYRINGE
INTRAMUSCULAR | Status: AC
  Filled 2017-09-01: qty 5

## 2017-09-01 MED ORDER — ONDANSETRON HCL 4 MG/2ML IJ SOLN
INTRAMUSCULAR | Status: DC | PRN
  Administered 2017-09-01: 4 mg via INTRAVENOUS

## 2017-09-01 MED ORDER — LIDOCAINE 2% (20 MG/ML) 5 ML SYRINGE
INTRAMUSCULAR | Status: DC | PRN
  Administered 2017-09-01: 100 mg via INTRAVENOUS

## 2017-09-01 MED ORDER — DEXAMETHASONE SODIUM PHOSPHATE 10 MG/ML IJ SOLN
INTRAMUSCULAR | Status: AC
  Filled 2017-09-01: qty 1

## 2017-09-01 MED ORDER — HYDROMORPHONE HCL 1 MG/ML IJ SOLN
0.2500 mg | INTRAMUSCULAR | Status: DC | PRN
  Filled 2017-09-01: qty 0.5

## 2017-09-01 MED ORDER — OXYCODONE HCL 5 MG PO TABS
ORAL_TABLET | ORAL | Status: AC
  Filled 2017-09-01: qty 1

## 2017-09-01 MED ORDER — LACTATED RINGERS IV SOLN
INTRAVENOUS | Status: DC
  Administered 2017-09-01: 12:00:00 via INTRAVENOUS
  Filled 2017-09-01: qty 1000

## 2017-09-01 MED ORDER — PROPOFOL 10 MG/ML IV BOLUS
INTRAVENOUS | Status: AC
  Filled 2017-09-01: qty 40

## 2017-09-01 MED ORDER — PROPOFOL 10 MG/ML IV BOLUS
INTRAVENOUS | Status: DC | PRN
  Administered 2017-09-01: 40 mg via INTRAVENOUS
  Administered 2017-09-01: 100 mg via INTRAVENOUS
  Administered 2017-09-01: 160 mg via INTRAVENOUS
  Administered 2017-09-01: 40 mg via INTRAVENOUS

## 2017-09-01 MED ORDER — MEPERIDINE HCL 25 MG/ML IJ SOLN
6.2500 mg | INTRAMUSCULAR | Status: DC | PRN
  Filled 2017-09-01: qty 1

## 2017-09-01 MED ORDER — LACTATED RINGERS IV SOLN
INTRAVENOUS | Status: DC
  Administered 2017-09-01: 14:00:00 via INTRAVENOUS
  Filled 2017-09-01: qty 1000

## 2017-09-01 MED ORDER — CEPHALEXIN 500 MG PO CAPS: 500.0000 mg | ORAL_CAPSULE | Freq: Three times a day (TID) | ORAL | 0 refills | Status: AC

## 2017-09-01 MED ORDER — FENTANYL CITRATE (PF) 100 MCG/2ML IJ SOLN
INTRAMUSCULAR | Status: AC
  Filled 2017-09-01: qty 2

## 2017-09-01 MED ORDER — DEXAMETHASONE SODIUM PHOSPHATE 10 MG/ML IJ SOLN
INTRAMUSCULAR | Status: DC | PRN
  Administered 2017-09-01: 10 mg via INTRAVENOUS

## 2017-09-01 MED ORDER — PROMETHAZINE HCL 25 MG/ML IJ SOLN
6.2500 mg | INTRAMUSCULAR | Status: DC | PRN
  Filled 2017-09-01: qty 1

## 2017-09-01 MED ORDER — OXYCODONE HCL 5 MG/5ML PO SOLN
5.0000 mg | Freq: Once | ORAL | Status: AC | PRN
  Filled 2017-09-01: qty 5

## 2017-09-01 MED ORDER — OXYCODONE HCL 5 MG PO TABS
5.0000 mg | ORAL_TABLET | Freq: Once | ORAL | Status: AC | PRN
  Administered 2017-09-01: 5 mg via ORAL
  Filled 2017-09-01: qty 1

## 2017-09-01 MED ORDER — FENTANYL CITRATE (PF) 100 MCG/2ML IJ SOLN
INTRAMUSCULAR | Status: DC | PRN
  Administered 2017-09-01: 25 ug via INTRAVENOUS
  Administered 2017-09-01: 50 ug via INTRAVENOUS

## 2017-09-01 MED ORDER — ONDANSETRON HCL 4 MG/2ML IJ SOLN
INTRAMUSCULAR | Status: AC
  Filled 2017-09-01: qty 2

## 2017-09-01 SURGICAL SUPPLY — 31 items
ADH SKN CLS APL DERMABOND .7 (GAUZE/BANDAGES/DRESSINGS) ×1
BLADE HEX COATED 2.75 (ELECTRODE) ×3 IMPLANT
BLADE SURG 15 STRL LF DISP TIS (BLADE) ×1 IMPLANT
BLADE SURG 15 STRL SS (BLADE) ×3
BNDG GAUZE ELAST 4 BULKY (GAUZE/BANDAGES/DRESSINGS) ×3 IMPLANT
COVER BACK TABLE 60X90IN (DRAPES) ×3 IMPLANT
COVER MAYO STAND STRL (DRAPES) ×3 IMPLANT
DERMABOND ADVANCED (GAUZE/BANDAGES/DRESSINGS) ×2
DERMABOND ADVANCED .7 DNX12 (GAUZE/BANDAGES/DRESSINGS) IMPLANT
DRAPE LAPAROTOMY 100X72 PEDS (DRAPES) ×3 IMPLANT
ELECT REM PT RETURN 9FT ADLT (ELECTROSURGICAL) ×3
ELECTRODE REM PT RTRN 9FT ADLT (ELECTROSURGICAL) ×1 IMPLANT
GLOVE BIOGEL M STRL SZ7.5 (GLOVE) ×3 IMPLANT
GLOVE BIOGEL PI IND STRL 7.5 (GLOVE) ×1 IMPLANT
GLOVE BIOGEL PI INDICATOR 7.5 (GLOVE) ×2
GOWN STRL REUS W/TWL LRG LVL3 (GOWN DISPOSABLE) ×3 IMPLANT
NEEDLE HYPO 22GX1.5 SAFETY (NEEDLE) IMPLANT
NS IRRIG 500ML POUR BTL (IV SOLUTION) ×3 IMPLANT
PACK BASIN DAY SURGERY FS (CUSTOM PROCEDURE TRAY) ×3 IMPLANT
PENCIL BUTTON HOLSTER BLD 10FT (ELECTRODE) ×3 IMPLANT
SUPPORT SCROTAL LG STRP (MISCELLANEOUS) ×2 IMPLANT
SUPPORTER ATHLETIC LG (MISCELLANEOUS) ×1
SUT CHROMIC 3 0 SH 27 (SUTURE) ×6 IMPLANT
SUT VIC AB 2-0 UR5 27 (SUTURE) IMPLANT
SUT VIC AB 4-0 PS2 18 (SUTURE) ×2 IMPLANT
SUT VICRYL 0 TIES 12 18 (SUTURE) IMPLANT
SYR CONTROL 10ML LL (SYRINGE) IMPLANT
TOWEL OR 17X24 6PK STRL BLUE (TOWEL DISPOSABLE) ×6 IMPLANT
TUBE CONNECTING 12'X1/4 (SUCTIONS) ×1
TUBE CONNECTING 12X1/4 (SUCTIONS) ×2 IMPLANT
YANKAUER SUCT BULB TIP NO VENT (SUCTIONS) ×3 IMPLANT

## 2017-09-01 NOTE — Discharge Instructions (Signed)
Call your surgeon if you experience:  ° °1.  Fever over 101.0. °2.  Inability to urinate. °3.  Nausea and/or vomiting. °4.  Extreme swelling or bruising at the surgical site. °5.  Continued bleeding from the incision. °6.  Increased pain, redness or drainage from the incision. °7.  Problems related to your pain medication. °8.  Any problems and/or concerns °Post Anesthesia Home Care Instructions ° °Activity: °Get plenty of rest for the remainder of the day. A responsible individual must stay with you for 24 hours following the procedure.  °For the next 24 hours, DO NOT: °-Drive a car °-Operate machinery °-Drink alcoholic beverages °-Take any medication unless instructed by your physician °-Make any legal decisions or sign important papers. ° °Meals: °Start with liquid foods such as gelatin or soup. Progress to regular foods as tolerated. Avoid greasy, spicy, heavy foods. If nausea and/or vomiting occur, drink only clear liquids until the nausea and/or vomiting subsides. Call your physician if vomiting continues. ° °Special Instructions/Symptoms: °Your throat may feel dry or sore from the anesthesia or the breathing tube placed in your throat during surgery. If this causes discomfort, gargle with warm salt water. The discomfort should disappear within 24 hours. ° ° °  ° °

## 2017-09-01 NOTE — Anesthesia Preprocedure Evaluation (Addendum)
Anesthesia Evaluation  Patient identified by MRN, date of birth, ID band Patient awake    Reviewed: Allergy & Precautions, NPO status , Patient's Chart, lab work & pertinent test results  Airway Mallampati: III  TM Distance: >3 FB Neck ROM: Full  Mouth opening: Limited Mouth Opening  Dental  (+) Teeth Intact, Dental Advisory Given   Pulmonary former smoker,    breath sounds clear to auscultation       Cardiovascular negative cardio ROS   Rhythm:Regular Rate:Normal     Neuro/Psych negative neurological ROS  negative psych ROS   GI/Hepatic Neg liver ROS, GERD  ,  Endo/Other  diabetes, Type 2, Oral Hypoglycemic Agents  Renal/GU   negative genitourinary   Musculoskeletal  (+) Arthritis , Osteoarthritis,    Abdominal Normal abdominal exam  (+)   Peds  Hematology negative hematology ROS (+)   Anesthesia Other Findings   Reproductive/Obstetrics                            Anesthesia Physical Anesthesia Plan  ASA: II  Anesthesia Plan: General   Post-op Pain Management:    Induction: Intravenous  PONV Risk Score and Plan: 3 and Ondansetron, Dexamethasone, Treatment may vary due to age or medical condition and Midazolam  Airway Management Planned: LMA  Additional Equipment: None  Intra-op Plan:   Post-operative Plan: Extubation in OR  Informed Consent: I have reviewed the patients History and Physical, chart, labs and discussed the procedure including the risks, benefits and alternatives for the proposed anesthesia with the patient or authorized representative who has indicated his/her understanding and acceptance.   Dental advisory given  Plan Discussed with: CRNA  Anesthesia Plan Comments:         Anesthesia Quick Evaluation

## 2017-09-01 NOTE — Op Note (Signed)
Operative Note  Preoperative diagnosis:  1.  Right hydrocele  Postoperative diagnosis: 1.  Right hydrocele  Procedure(s): 1.  Right hydrocelectomy  Surgeon: Ellison Hughs, MD  Assistants: None  Anesthesia: General endotracheal  Complications: None  EBL: 5 mL  Specimens: 1.  Right hydrocele sac  Drains/Catheters: 1.  None  Intraoperative findings: Right hydrocele  Indication:  Trampas Stettner is a 72 y.o. male with a history of bilateral hydroceles (s/p left hydrocelectomy in 2011). He presents today with a 1 year history of progressive enlargement of his right sided hydrocele. He states that the hydrocele is affecting his ability to have intercourse and affecting his penile length when urinating (especially when cold, per the patient). He denies interval urinary tract infections including epididymitis/orchitis since he was last evaluated. The right testicle is nonpainful, but just uncomfortable due to a mass effect. A scrotal ultrasound from 2007 was reviewed and showed no parenchymal lesions involving either testicle.  The risk, benefits and alternatives of the after mentioned procedure was discussed with the patient.  He voices understanding and wishes to proceed.   Description of procedure:  After informed consent was obtained, the patient was brought to the operating room and general endotracheal anesthesia was administered.  The patient was then placed in the supine position and prepped and draped in usual sterile fashion.  A timeout was then performed.  A 4 cm mid scrotal, vertical incision was then made along the median raphae.  The overlying dartos and cremasteric tissue layers were then incised using electrocautery.  The right hydrocele/testicle was then expressed through the incision.  A scalpel was then used to make a 1 cm incision in the hydrocele sac, immediately draining clear fluid (approximately 300 mL).  The redundant hydrocele sac was then excised.  Using a  running 3-0 chromic suture, the remaining tunica vaginalis was then reapproximated in a bottle neck fashion.  The right testicle was then placed back into the right hemiscrotum, and the correct anatomic orientation assuring no torsion of the spermatic cord.  The overlying dartos fascia was then reapproximated using a running 3-0 chromic suture.  The skin incision was then reapproximated using a 4-0 Monocryl in a running horizontal mattress and dressed with Dermabond.  Scrotal fluffs and a jock strap was then applied.  The patient tolerated the procedure well and was transferred to the postanesthesia unit in stable condition.  Plan: Follow-up in 2 weeks for a wound check.

## 2017-09-01 NOTE — Transfer of Care (Signed)
Immediate Anesthesia Transfer of Care Note  Patient: Luke Alvarez  Procedure(s) Performed: HYDROCELECTOMY ADULT (Right )  Patient Location: PACU  Anesthesia Type:General  Level of Consciousness: drowsy and responds to stimulation  Airway & Oxygen Therapy: nasal cannula  Post-op Assessment: Report given to RN  Post vital signs: Reviewed and stable  Last Vitals:  Vitals:   09/01/17 1049 09/01/17 1315  BP: (!) 141/87 127/81  Pulse: (!) 59 77  Resp: 16 14  Temp: 36.4 C 36.9 C  SpO2: 97% 95%    Last Pain:  Vitals:   09/01/17 1049  TempSrc: Oral         Complications: No apparent anesthesia complications

## 2017-09-01 NOTE — Anesthesia Procedure Notes (Deleted)
Performed by: Calbert Hulsebus T, CRNA       

## 2017-09-01 NOTE — Anesthesia Procedure Notes (Signed)
Procedure Name: Intubation Date/Time: 09/01/2017 12:39 PM Performed by: Bonney Aid, CRNA Pre-anesthesia Checklist: Patient identified, Emergency Drugs available, Suction available and Patient being monitored Patient Re-evaluated:Patient Re-evaluated prior to induction Oxygen Delivery Method: Circle system utilized Preoxygenation: Pre-oxygenation with 100% oxygen Induction Type: IV induction Ventilation: Mask ventilation without difficulty Laryngoscope Size: Mac and 4 Grade View: Grade II Tube type: Oral Tube size: 8.0 mm Number of attempts: 1 Airway Equipment and Method: Stylet and Oral airway Placement Confirmation: ETT inserted through vocal cords under direct vision,  positive ETCO2 and breath sounds checked- equal and bilateral Secured at: 23 cm Tube secured with: Tape Dental Injury: Teeth and Oropharynx as per pre-operative assessment

## 2017-09-01 NOTE — Interval H&P Note (Signed)
History and Physical Interval Note:  09/01/2017 12:05 PM  Luke Alvarez  has presented today for surgery, with the diagnosis of RIGHT HYDROCELE  The various methods of treatment have been discussed with the patient and family. After consideration of risks, benefits and other options for treatment, the patient has consented to  Procedure(s): HYDROCELECTOMY ADULT (Right) as a surgical intervention .  The patient's history has been reviewed, patient examined, no change in status, stable for surgery.  I have reviewed the patient's chart and labs.  Questions were answered to the patient's satisfaction.     Conception Oms Winter

## 2017-09-01 NOTE — Anesthesia Procedure Notes (Signed)
Procedure Name: LMA Insertion Date/Time: 09/01/2017 12:22 PM Performed by: Bonney Aid, CRNA Pre-anesthesia Checklist: Patient identified, Emergency Drugs available, Suction available and Patient being monitored Patient Re-evaluated:Patient Re-evaluated prior to induction Oxygen Delivery Method: Circle system utilized Preoxygenation: Pre-oxygenation with 100% oxygen Induction Type: IV induction Ventilation: Mask ventilation without difficulty LMA: LMA inserted LMA Size: 5.0 Number of attempts: 1 Airway Equipment and Method: Bite block Placement Confirmation: positive ETCO2 Tube secured with: Tape Dental Injury: Teeth and Oropharynx as per pre-operative assessment  Comments: Pt light with placement, spont vent with leak.  Repositioned and deepened

## 2017-09-02 ENCOUNTER — Encounter (HOSPITAL_BASED_OUTPATIENT_CLINIC_OR_DEPARTMENT_OTHER): Payer: Self-pay | Admitting: Urology

## 2017-09-02 NOTE — Anesthesia Postprocedure Evaluation (Signed)
Anesthesia Post Note  Patient: Luke Alvarez  Procedure(s) Performed: HYDROCELECTOMY ADULT (Right )     Patient location during evaluation: PACU Anesthesia Type: General Level of consciousness: awake and alert Pain management: pain level controlled Vital Signs Assessment: post-procedure vital signs reviewed and stable Respiratory status: spontaneous breathing, nonlabored ventilation, respiratory function stable and patient connected to nasal cannula oxygen Cardiovascular status: blood pressure returned to baseline and stable Postop Assessment: no apparent nausea or vomiting Anesthetic complications: no    Last Vitals:  Vitals:   09/01/17 1345 09/01/17 1435  BP: 136/87 (!) 148/79  Pulse: 62 (!) 57  Resp: 14 17  Temp:  36.7 C  SpO2: 96% 100%    Last Pain:  Vitals:   09/01/17 1435  TempSrc: Oral  PainSc:    Pain Goal:                 Barnet Glasgow

## 2017-09-15 DIAGNOSIS — N433 Hydrocele, unspecified: Secondary | ICD-10-CM | POA: Diagnosis not present

## 2017-12-08 DIAGNOSIS — E1122 Type 2 diabetes mellitus with diabetic chronic kidney disease: Secondary | ICD-10-CM | POA: Diagnosis not present

## 2017-12-08 DIAGNOSIS — N183 Chronic kidney disease, stage 3 (moderate): Secondary | ICD-10-CM | POA: Diagnosis not present

## 2017-12-09 DIAGNOSIS — N183 Chronic kidney disease, stage 3 (moderate): Secondary | ICD-10-CM | POA: Diagnosis not present

## 2017-12-09 DIAGNOSIS — E119 Type 2 diabetes mellitus without complications: Secondary | ICD-10-CM | POA: Diagnosis not present

## 2017-12-16 DIAGNOSIS — N433 Hydrocele, unspecified: Secondary | ICD-10-CM | POA: Diagnosis not present

## 2017-12-20 DIAGNOSIS — R030 Elevated blood-pressure reading, without diagnosis of hypertension: Secondary | ICD-10-CM | POA: Diagnosis not present

## 2017-12-20 DIAGNOSIS — N529 Male erectile dysfunction, unspecified: Secondary | ICD-10-CM | POA: Diagnosis not present

## 2017-12-20 DIAGNOSIS — E7800 Pure hypercholesterolemia, unspecified: Secondary | ICD-10-CM | POA: Diagnosis not present

## 2017-12-20 DIAGNOSIS — Z8711 Personal history of peptic ulcer disease: Secondary | ICD-10-CM | POA: Diagnosis not present

## 2017-12-20 DIAGNOSIS — Z7984 Long term (current) use of oral hypoglycemic drugs: Secondary | ICD-10-CM | POA: Diagnosis not present

## 2017-12-20 DIAGNOSIS — E119 Type 2 diabetes mellitus without complications: Secondary | ICD-10-CM | POA: Diagnosis not present

## 2017-12-20 DIAGNOSIS — N183 Chronic kidney disease, stage 3 (moderate): Secondary | ICD-10-CM | POA: Diagnosis not present

## 2018-01-28 DIAGNOSIS — E119 Type 2 diabetes mellitus without complications: Secondary | ICD-10-CM | POA: Diagnosis not present

## 2018-01-28 DIAGNOSIS — N183 Chronic kidney disease, stage 3 (moderate): Secondary | ICD-10-CM | POA: Diagnosis not present

## 2018-01-28 DIAGNOSIS — Z7984 Long term (current) use of oral hypoglycemic drugs: Secondary | ICD-10-CM | POA: Diagnosis not present

## 2018-04-11 DIAGNOSIS — Z7984 Long term (current) use of oral hypoglycemic drugs: Secondary | ICD-10-CM | POA: Diagnosis not present

## 2018-04-11 DIAGNOSIS — E119 Type 2 diabetes mellitus without complications: Secondary | ICD-10-CM | POA: Diagnosis not present

## 2018-04-11 DIAGNOSIS — N183 Chronic kidney disease, stage 3 (moderate): Secondary | ICD-10-CM | POA: Diagnosis not present

## 2018-04-27 DIAGNOSIS — N183 Chronic kidney disease, stage 3 (moderate): Secondary | ICD-10-CM | POA: Diagnosis not present

## 2018-04-27 DIAGNOSIS — E119 Type 2 diabetes mellitus without complications: Secondary | ICD-10-CM | POA: Diagnosis not present

## 2018-04-27 DIAGNOSIS — Z7984 Long term (current) use of oral hypoglycemic drugs: Secondary | ICD-10-CM | POA: Diagnosis not present

## 2018-06-07 DIAGNOSIS — E119 Type 2 diabetes mellitus without complications: Secondary | ICD-10-CM | POA: Diagnosis not present

## 2018-06-07 DIAGNOSIS — N183 Chronic kidney disease, stage 3 (moderate): Secondary | ICD-10-CM | POA: Diagnosis not present

## 2018-06-27 DIAGNOSIS — Z0000 Encounter for general adult medical examination without abnormal findings: Secondary | ICD-10-CM | POA: Diagnosis not present

## 2018-06-27 DIAGNOSIS — E1169 Type 2 diabetes mellitus with other specified complication: Secondary | ICD-10-CM | POA: Diagnosis not present

## 2018-06-27 DIAGNOSIS — E7800 Pure hypercholesterolemia, unspecified: Secondary | ICD-10-CM | POA: Diagnosis not present

## 2018-06-27 DIAGNOSIS — E1122 Type 2 diabetes mellitus with diabetic chronic kidney disease: Secondary | ICD-10-CM | POA: Diagnosis not present

## 2018-06-27 DIAGNOSIS — Z1389 Encounter for screening for other disorder: Secondary | ICD-10-CM | POA: Diagnosis not present

## 2018-06-27 DIAGNOSIS — R030 Elevated blood-pressure reading, without diagnosis of hypertension: Secondary | ICD-10-CM | POA: Diagnosis not present

## 2018-06-27 DIAGNOSIS — E1129 Type 2 diabetes mellitus with other diabetic kidney complication: Secondary | ICD-10-CM | POA: Diagnosis not present

## 2018-06-27 DIAGNOSIS — N529 Male erectile dysfunction, unspecified: Secondary | ICD-10-CM | POA: Diagnosis not present

## 2018-06-27 DIAGNOSIS — E119 Type 2 diabetes mellitus without complications: Secondary | ICD-10-CM | POA: Diagnosis not present

## 2018-06-27 DIAGNOSIS — N183 Chronic kidney disease, stage 3 (moderate): Secondary | ICD-10-CM | POA: Diagnosis not present

## 2018-06-30 DIAGNOSIS — Z7984 Long term (current) use of oral hypoglycemic drugs: Secondary | ICD-10-CM | POA: Diagnosis not present

## 2018-06-30 DIAGNOSIS — E1169 Type 2 diabetes mellitus with other specified complication: Secondary | ICD-10-CM | POA: Diagnosis not present

## 2018-09-01 DIAGNOSIS — E1129 Type 2 diabetes mellitus with other diabetic kidney complication: Secondary | ICD-10-CM | POA: Diagnosis not present

## 2018-09-01 DIAGNOSIS — E1169 Type 2 diabetes mellitus with other specified complication: Secondary | ICD-10-CM | POA: Diagnosis not present

## 2018-09-01 DIAGNOSIS — N183 Chronic kidney disease, stage 3 (moderate): Secondary | ICD-10-CM | POA: Diagnosis not present

## 2018-10-11 DIAGNOSIS — E1129 Type 2 diabetes mellitus with other diabetic kidney complication: Secondary | ICD-10-CM | POA: Diagnosis not present

## 2018-10-11 DIAGNOSIS — E1169 Type 2 diabetes mellitus with other specified complication: Secondary | ICD-10-CM | POA: Diagnosis not present

## 2018-10-11 DIAGNOSIS — N183 Chronic kidney disease, stage 3 (moderate): Secondary | ICD-10-CM | POA: Diagnosis not present

## 2018-12-07 DIAGNOSIS — H524 Presbyopia: Secondary | ICD-10-CM | POA: Diagnosis not present

## 2018-12-07 DIAGNOSIS — Z135 Encounter for screening for eye and ear disorders: Secondary | ICD-10-CM | POA: Diagnosis not present

## 2018-12-07 DIAGNOSIS — Z0100 Encounter for examination of eyes and vision without abnormal findings: Secondary | ICD-10-CM | POA: Diagnosis not present

## 2018-12-28 DIAGNOSIS — N529 Male erectile dysfunction, unspecified: Secondary | ICD-10-CM | POA: Diagnosis not present

## 2018-12-28 DIAGNOSIS — Z7984 Long term (current) use of oral hypoglycemic drugs: Secondary | ICD-10-CM | POA: Diagnosis not present

## 2018-12-28 DIAGNOSIS — Z8711 Personal history of peptic ulcer disease: Secondary | ICD-10-CM | POA: Diagnosis not present

## 2018-12-28 DIAGNOSIS — E7800 Pure hypercholesterolemia, unspecified: Secondary | ICD-10-CM | POA: Diagnosis not present

## 2018-12-28 DIAGNOSIS — E1129 Type 2 diabetes mellitus with other diabetic kidney complication: Secondary | ICD-10-CM | POA: Diagnosis not present

## 2018-12-28 DIAGNOSIS — R030 Elevated blood-pressure reading, without diagnosis of hypertension: Secondary | ICD-10-CM | POA: Diagnosis not present

## 2018-12-28 DIAGNOSIS — E1122 Type 2 diabetes mellitus with diabetic chronic kidney disease: Secondary | ICD-10-CM | POA: Diagnosis not present

## 2018-12-28 DIAGNOSIS — E1169 Type 2 diabetes mellitus with other specified complication: Secondary | ICD-10-CM | POA: Diagnosis not present

## 2018-12-28 DIAGNOSIS — N183 Chronic kidney disease, stage 3 (moderate): Secondary | ICD-10-CM | POA: Diagnosis not present

## 2018-12-29 DIAGNOSIS — E1169 Type 2 diabetes mellitus with other specified complication: Secondary | ICD-10-CM | POA: Diagnosis not present

## 2018-12-29 DIAGNOSIS — E7800 Pure hypercholesterolemia, unspecified: Secondary | ICD-10-CM | POA: Diagnosis not present

## 2019-07-04 DIAGNOSIS — E1169 Type 2 diabetes mellitus with other specified complication: Secondary | ICD-10-CM | POA: Diagnosis not present

## 2019-07-04 DIAGNOSIS — N183 Chronic kidney disease, stage 3 unspecified: Secondary | ICD-10-CM | POA: Diagnosis not present

## 2019-07-04 DIAGNOSIS — Z8711 Personal history of peptic ulcer disease: Secondary | ICD-10-CM | POA: Diagnosis not present

## 2019-07-04 DIAGNOSIS — Z0000 Encounter for general adult medical examination without abnormal findings: Secondary | ICD-10-CM | POA: Diagnosis not present

## 2019-07-04 DIAGNOSIS — E7800 Pure hypercholesterolemia, unspecified: Secondary | ICD-10-CM | POA: Diagnosis not present

## 2019-07-04 DIAGNOSIS — Z1389 Encounter for screening for other disorder: Secondary | ICD-10-CM | POA: Diagnosis not present

## 2019-07-04 DIAGNOSIS — R030 Elevated blood-pressure reading, without diagnosis of hypertension: Secondary | ICD-10-CM | POA: Diagnosis not present

## 2019-07-04 DIAGNOSIS — E1122 Type 2 diabetes mellitus with diabetic chronic kidney disease: Secondary | ICD-10-CM | POA: Diagnosis not present

## 2019-07-04 DIAGNOSIS — N529 Male erectile dysfunction, unspecified: Secondary | ICD-10-CM | POA: Diagnosis not present

## 2019-07-04 DIAGNOSIS — E1129 Type 2 diabetes mellitus with other diabetic kidney complication: Secondary | ICD-10-CM | POA: Diagnosis not present

## 2019-07-06 DIAGNOSIS — E1129 Type 2 diabetes mellitus with other diabetic kidney complication: Secondary | ICD-10-CM | POA: Diagnosis not present

## 2019-07-06 DIAGNOSIS — E7800 Pure hypercholesterolemia, unspecified: Secondary | ICD-10-CM | POA: Diagnosis not present

## 2019-07-06 DIAGNOSIS — E1169 Type 2 diabetes mellitus with other specified complication: Secondary | ICD-10-CM | POA: Diagnosis not present

## 2019-08-24 DIAGNOSIS — R4020 Unspecified coma: Secondary | ICD-10-CM | POA: Diagnosis not present

## 2019-08-24 DIAGNOSIS — R0902 Hypoxemia: Secondary | ICD-10-CM | POA: Diagnosis not present

## 2019-08-24 DIAGNOSIS — R55 Syncope and collapse: Secondary | ICD-10-CM | POA: Diagnosis not present

## 2019-08-24 DIAGNOSIS — I4510 Unspecified right bundle-branch block: Secondary | ICD-10-CM | POA: Diagnosis not present

## 2019-08-24 DIAGNOSIS — R110 Nausea: Secondary | ICD-10-CM | POA: Diagnosis not present

## 2019-08-25 ENCOUNTER — Emergency Department (HOSPITAL_COMMUNITY)
Admission: EM | Admit: 2019-08-25 | Discharge: 2019-08-25 | Disposition: A | Discharge status: Home / Self Care | Payer: Medicare HMO | Attending: Emergency Medicine | Admitting: Emergency Medicine

## 2019-08-25 ENCOUNTER — Emergency Department (HOSPITAL_COMMUNITY): Payer: Medicare HMO

## 2019-08-25 ENCOUNTER — Other Ambulatory Visit: Payer: Self-pay

## 2019-08-25 ENCOUNTER — Other Ambulatory Visit: Payer: Self-pay | Admitting: Nurse Practitioner

## 2019-08-25 ENCOUNTER — Encounter (HOSPITAL_COMMUNITY): Payer: Self-pay

## 2019-08-25 ENCOUNTER — Ambulatory Visit (HOSPITAL_COMMUNITY)
Admission: RE | Admit: 2019-08-25 | Discharge: 2019-08-25 | Disposition: A | Discharge status: Home / Self Care | Payer: Medicare Other | Source: Ambulatory Visit | Attending: Pulmonary Disease | Admitting: Pulmonary Disease

## 2019-08-25 DIAGNOSIS — U071 COVID-19: Secondary | ICD-10-CM | POA: Diagnosis not present

## 2019-08-25 DIAGNOSIS — Z79899 Other long term (current) drug therapy: Secondary | ICD-10-CM | POA: Diagnosis not present

## 2019-08-25 DIAGNOSIS — Z7982 Long term (current) use of aspirin: Secondary | ICD-10-CM | POA: Diagnosis not present

## 2019-08-25 DIAGNOSIS — Z23 Encounter for immunization: Secondary | ICD-10-CM | POA: Insufficient documentation

## 2019-08-25 DIAGNOSIS — R05 Cough: Secondary | ICD-10-CM | POA: Diagnosis not present

## 2019-08-25 DIAGNOSIS — Z87891 Personal history of nicotine dependence: Secondary | ICD-10-CM | POA: Insufficient documentation

## 2019-08-25 DIAGNOSIS — E119 Type 2 diabetes mellitus without complications: Secondary | ICD-10-CM | POA: Insufficient documentation

## 2019-08-25 DIAGNOSIS — R55 Syncope and collapse: Secondary | ICD-10-CM | POA: Diagnosis not present

## 2019-08-25 LAB — CBC WITH DIFFERENTIAL/PLATELET
Abs Immature Granulocytes: 0.01 K/uL (ref 0.00–0.07)
Basophils Absolute: 0 K/uL (ref 0.0–0.1)
Basophils Relative: 1 %
Eosinophils Absolute: 0 K/uL (ref 0.0–0.5)
Eosinophils Relative: 0 %
HCT: 50.9 % (ref 39.0–52.0)
Hemoglobin: 16.5 g/dL (ref 13.0–17.0)
Immature Granulocytes: 0 %
Lymphocytes Relative: 20 %
Lymphs Abs: 1.3 K/uL (ref 0.7–4.0)
MCH: 29.7 pg (ref 26.0–34.0)
MCHC: 32.4 g/dL (ref 30.0–36.0)
MCV: 91.5 fL (ref 80.0–100.0)
Monocytes Absolute: 0.8 K/uL (ref 0.1–1.0)
Monocytes Relative: 13 %
Neutro Abs: 4.1 K/uL (ref 1.7–7.7)
Neutrophils Relative %: 66 %
Platelets: 150 K/uL (ref 150–400)
RBC: 5.56 MIL/uL (ref 4.22–5.81)
RDW: 13.2 % (ref 11.5–15.5)
WBC: 6.3 K/uL (ref 4.0–10.5)
nRBC: 0 % (ref 0.0–0.2)

## 2019-08-25 LAB — COMPREHENSIVE METABOLIC PANEL
ALT: 45 U/L — ABNORMAL HIGH (ref 0–44)
AST: 50 U/L — ABNORMAL HIGH (ref 15–41)
Albumin: 3.6 g/dL (ref 3.5–5.0)
Alkaline Phosphatase: 56 U/L (ref 38–126)
Anion gap: 10 (ref 5–15)
BUN: 16 mg/dL (ref 8–23)
CO2: 21 mmol/L — ABNORMAL LOW (ref 22–32)
Calcium: 8.8 mg/dL — ABNORMAL LOW (ref 8.9–10.3)
Chloride: 107 mmol/L (ref 98–111)
Creatinine, Ser: 1.71 mg/dL — ABNORMAL HIGH (ref 0.61–1.24)
GFR calc Af Amer: 45 mL/min — ABNORMAL LOW (ref >60)
GFR calc non Af Amer: 39 mL/min — ABNORMAL LOW (ref >60)
Glucose, Bld: 113 mg/dL — ABNORMAL HIGH (ref 70–99)
Potassium: 4.3 mmol/L (ref 3.5–5.1)
Sodium: 138 mmol/L (ref 135–145)
Total Bilirubin: 1 mg/dL (ref 0.3–1.2)
Total Protein: 6.6 g/dL (ref 6.5–8.1)

## 2019-08-25 LAB — LACTIC ACID, PLASMA: Lactic Acid, Venous: 1.2 mmol/L (ref 0.5–1.9)

## 2019-08-25 LAB — POC SARS CORONAVIRUS 2 AG -  ED: SARS Coronavirus 2 Ag: POSITIVE — AB

## 2019-08-25 MED ORDER — DIPHENHYDRAMINE HCL 50 MG/ML IJ SOLN: 50.0000 mg | Freq: Once | INTRAMUSCULAR | Status: DC | PRN

## 2019-08-25 MED ORDER — SODIUM CHLORIDE 0.9 % IV SOLN
700.0000 mg | Freq: Once | INTRAVENOUS | Status: AC
  Administered 2019-08-25: 700 mg via INTRAVENOUS
  Filled 2019-08-25: qty 20

## 2019-08-25 MED ORDER — FAMOTIDINE IN NACL 20-0.9 MG/50ML-% IV SOLN: 20.0000 mg | Freq: Once | INTRAVENOUS | Status: DC | PRN

## 2019-08-25 MED ORDER — EPINEPHRINE 0.3 MG/0.3ML IJ SOAJ: 0.3000 mg | Freq: Once | INTRAMUSCULAR | Status: DC | PRN

## 2019-08-25 MED ORDER — METHYLPREDNISOLONE SODIUM SUCC 125 MG IJ SOLR: 125.0000 mg | Freq: Once | INTRAMUSCULAR | Status: DC | PRN

## 2019-08-25 MED ORDER — SODIUM CHLORIDE 0.9 % IV SOLN
INTRAVENOUS | Status: DC | PRN
  Administered 2019-08-25: 250 mL via INTRAVENOUS

## 2019-08-25 MED ORDER — ALBUTEROL SULFATE HFA 108 (90 BASE) MCG/ACT IN AERS: 2.0000 | INHALATION_SPRAY | Freq: Once | RESPIRATORY_TRACT | Status: DC | PRN

## 2019-08-25 MED ORDER — SODIUM CHLORIDE 0.9 % IV BOLUS (SEPSIS)
500.0000 mL | Freq: Once | INTRAVENOUS | Status: AC
  Administered 2019-08-25: 02:00:00 500 mL via INTRAVENOUS

## 2019-08-25 NOTE — Progress Notes (Signed)
  Diagnosis: COVID-19  Physician: Dr. Joya Gaskins  Procedure: [facility] Med: bamlanivimab infusion - Provided patient with bamlanimivab fact sheet for patients, parents and caregivers prior to infusion.  Complications: No immediate complications noted.  Discharge: Discharged home   Babs Sciara 08/25/2019

## 2019-08-25 NOTE — Discharge Instructions (Addendum)
You should receive a phone call later today about an antibody infusion

## 2019-08-25 NOTE — ED Provider Notes (Signed)
[facility] EMERGENCY DEPARTMENT Provider Note   CSN: [id number] Arrival date & time: 08/25/19  0001     History Chief Complaint  Patient presents with  . Loss of Consciousness    Luke Alvarez is a 74 y.o. male.  The history is provided by the patient.  Loss of Consciousness Episode history:  Single Most recent episode:  Today Timing:  Constant Progression:  Resolved Chronicity:  New Relieved by:  None tried Exacerbated by: Walking. Associated symptoms: fever   Associated symptoms: no chest pain, no headaches, no seizures, no shortness of breath and no vomiting   Patient with history of diabetes and hyperlipidemia presents after syncopal episode. Patient reports that he went downstairs to check his temperature.  After he walked back up the stairs he felt lightheaded and the next thing he knew he woke up on a carpeted floor.  He does not think he hit his head.  He denies any traumatic injury.  He denies any chest pain or shortness of breath  he reports over the past 24 hours he has had cough and low-grade fevers.  No vomiting/diarrhea.  No headache. He is not on anticoagulation.  Reports a negative COVID-19 test last week prior to a dental procedure at the [facility] hospital     Past Medical History:  Diagnosis Date  . Arthritis   . Diabetes mellitus without complication ([place])    type 2  . GERD (gastroesophageal reflux disease)   . Hyperlipidemia     There are no problems to display for this patient.   Past Surgical History:  Procedure Laterality Date  . CHOLECYSTECTOMY    . HYDROCELE EXCISION    . HYDROCELE EXCISION Right 09/01/2017   Procedure: HYDROCELECTOMY ADULT;  Surgeon: Ceasar Mons, MD;  Location: [facility];  Service: Urology;  Laterality: Right;  . SHOULDER SURGERY Left    rotator cuff       Family History  Problem Relation Age of Onset  . Colon cancer Neg Hx     Social History   Tobacco Use  . Smoking  status: Former Smoker    Packs/day: 1.00    Years: 40.00    Pack years: 40.00    Types: Cigarettes    Quit date: 2000    Years since quitting: 21.1  . Smokeless tobacco: Never Used  . Tobacco comment: quit 20 yrs ago  Substance Use Topics  . Alcohol use: Yes    Comment: occasional   . Drug use: No    Home Medications Prior to Admission medications   Medication Sig Start Date End Date Taking? Authorizing Provider  aspirin EC 81 MG tablet Take 81 mg by mouth daily.    [provider]  canagliflozin (INVOKANA) 100 MG TABS tablet Take 100 mg by mouth daily before breakfast.    [provider]  glimepiride (AMARYL) 2 MG tablet Take 2 mg by mouth daily with breakfast.    [provider]  HYDROcodone-acetaminophen (NORCO) 5-325 MG tablet Take 1 tablet by mouth every 4 (four) hours as needed for moderate pain. 09/01/17   Ceasar Mons, MD  Nutritional Supplements (L-GLUTAMINE/CHOLINE/INOSITOL PO) Take 1 tablet by mouth 2 (two) times daily.     [provider]  Omega-3 Fatty Acids (FISH OIL) 1000 MG CAPS Take 1 capsule by mouth daily at 2 PM.     [provider]  omeprazole (PRILOSEC) 20 MG capsule Take 20 mg by mouth daily.    [provider]  pioglitazone (ACTOS) 30 MG tablet Take 30 mg by mouth every evening.     [provider]  simvastatin (ZOCOR) 20 MG tablet Take 20 mg by mouth daily at 6 PM.    [provider]  UNABLE TO FIND Liquid multvitamin 1 per day    [provider]    Allergies    Patient has no known allergies.  Review of Systems   Review of Systems  Constitutional: Positive for fatigue and fever.  Respiratory: Positive for cough. Negative for shortness of breath.   Cardiovascular: Positive for syncope. Negative for chest pain.  Gastrointestinal: Negative for diarrhea and vomiting.  Neurological: Positive for syncope. Negative for seizures and headaches.  All other systems  reviewed and are negative.   Physical Exam Updated Vital Signs BP 125/75   Pulse 83   Temp 99.6 F (37.6 C) (Oral)   Resp 14   SpO2 95%   Physical Exam CONSTITUTIONAL: Well developed/well nourished HEAD: Normocephalic/atraumatic EYES: EOMI/PERRL ENMT: Mucous membranes moist NECK: supple no meningeal signs SPINE/BACK:entire spine nontender, no bruising/crepitance/stepoffs noted to spine CV: S1/S2 noted, no murmurs/rubs/gallops noted LUNGS: Lungs are clear to auscultation bilaterally, no apparent distress ABDOMEN: soft, nontender, no rebound or guarding, bowel sounds noted throughout abdomen GU:no cva tenderness NEURO: Pt is awake/alert/appropriate, moves all extremitiesx4.  No facial droop.   EXTREMITIES: pulses normal/equal, full ROM, all other extremities/joints palpated/ranged and nontender SKIN: warm, color normal PSYCH: no abnormalities of mood noted, alert and oriented to situation  ED Results / Procedures / Treatments   Labs (all labs ordered are listed, but only abnormal results are displayed) Labs Reviewed  COMPREHENSIVE METABOLIC PANEL - Abnormal; Notable for the following components:      Result Value   CO2 21 (*)    Glucose, Bld 113 (*)    Creatinine, Ser 1.71 (*)    Calcium 8.8 (*)    AST 50 (*)    ALT 45 (*)    GFR calc non Af Amer 39 (*)    GFR calc Af Amer 45 (*)    All other components within normal limits  POC SARS CORONAVIRUS 2 AG -  ED - Abnormal; Notable for the following components:   SARS Coronavirus 2 Ag POSITIVE (*)    All other components within normal limits  CBC WITH DIFFERENTIAL/PLATELET  LACTIC ACID, PLASMA    EKG      ED ECG REPORT   Date: 08/25/2019 0010  Rate: 84  Rhythm: normal sinus rhythm  QRS Axis: left  Intervals: normal  ST/T Wave abnormalities: nonspecific ST changes  Conduction Disutrbances:none  Narrative Interpretation: PAC  Old EKG Reviewed: unchanged  I have personally reviewed the EKG tracing and agree with  the computerized printout as noted.  Radiology DG Chest Port 1 View  Result Date: 08/25/2019 CLINICAL DATA:  Cough, syncope and cough EXAM: PORTABLE CHEST 1 VIEW COMPARISON:  Radiograph Nov 11, 2015 FINDINGS: Streaky opacities in the bases, likely atelectasis. No consolidation, features of edema, pneumothorax, or effusion. Pulmonary vascularity is normally distributed. The cardiomediastinal contours are unremarkable. No acute osseous or soft tissue abnormality. IMPRESSION: Some basilar atelectatic changes. Otherwise no acute cardiopulmonary abnormality. Electronically Signed   By: Lovena Le M.D.   On: 08/25/2019 01:20    Procedures Procedures   Medications Ordered in ED Medications  sodium chloride 0.9 % bolus 500 mL (0 mLs Intravenous Stopped 08/25/19 0254)    ED Course  I have reviewed the triage vital signs  and the nursing notes.  Pertinent labs & imaging results that were available during my care of the patient were reviewed by me and considered in my medical decision making (see chart for details).    MDM Rules/Calculators/A&P                      1:08 AM Patient presents after syncopal episode.  He reports recent fever.  I discussed with wife via phone.  She reports that she heard a thump in the bedroom and she checked on him and he responded almost immediately to his name.  He was not apneic and did not require CPR.  No seizures reported.  Work-up is pending at this time 3:24 AM Patient found to be Covid positive.  However he is improved.  No significant infiltrates on chest x-ray.  No hypoxia on ambulation, pulse ox greater than 93% on walking He is in no distress and denies dizziness.  Suspect his syncope was combination of febrile illness and dehydration. Patient was given a total of 1 L normal saline  Patient would like to be discharged home.  At this time I feel he is safe for discharge.  He will be given referral for outpatient monoclonal antibody infusion Discussed  this with patient and his wife via phone.  We discussed strict return precautions  Luke Alvarez was evaluated in Emergency Department on 08/25/2019 for the symptoms described in the history of present illness. He was evaluated in the context of the global COVID-19 pandemic, which necessitated consideration that the patient might be at risk for infection with the SARS-CoV-2 virus that causes COVID-19. Institutional protocols and algorithms that pertain to the evaluation of patients at risk for COVID-19 are in a state of rapid change based on information released by regulatory bodies including the CDC and federal and state organizations. These policies and algorithms were followed during the patient's care in the ED.  Final Clinical Impression(s) / ED Diagnoses Final diagnoses:  COVID-19  Syncope and collapse    Rx / DC Orders ED Discharge Orders    None       Ripley Fraise, MD 08/25/19 [phone]

## 2019-08-25 NOTE — ED Triage Notes (Signed)
Pt bib [facility] from home after c/o syncope. Pt did not hit head, was unconscious for a few seconds, no blood thinners. EMS VSS. Pt received 523mL NS with EMS.

## 2019-08-25 NOTE — Discharge Instructions (Signed)

## 2019-08-25 NOTE — Progress Notes (Signed)
  I connected by phone with Luke Alvarez on 08/25/2019 at 8:12 AM to discuss the potential use of an new treatment for mild to moderate COVID-19 viral infection in non-hospitalized patients.  This patient is a 74 y.o. male that meets the FDA criteria for Emergency Use Authorization of bamlanivimab or casirivimab\imdevimab.  Has a (+) direct SARS-CoV-2 viral test result  Has mild or moderate COVID-19   Is ? 74 years of age and weighs ? 40 kg  Is NOT hospitalized due to COVID-19  Is NOT requiring oxygen therapy or requiring an increase in baseline oxygen flow rate due to COVID-19  Is within 10 days of symptom onset  Has at least one of the high risk factor(s) for progression to severe COVID-19 and/or hospitalization as defined in EUA.  Specific high risk criteria : >/= 74 yo   I have spoken and communicated the following to the patient or parent/caregiver:  1. FDA has authorized the emergency use of bamlanivimab and casirivimab\imdevimab for the treatment of mild to moderate COVID-19 in adults and pediatric patients with positive results of direct SARS-CoV-2 viral testing who are 62 years of age and older weighing at least 40 kg, and who are at high risk for progressing to severe COVID-19 and/or hospitalization.  2. The significant known and potential risks and benefits of bamlanivimab and casirivimab\imdevimab, and the extent to which such potential risks and benefits are unknown.  3. Information on available alternative treatments and the risks and benefits of those alternatives, including clinical trials.  4. Patients treated with bamlanivimab and casirivimab\imdevimab should continue to self-isolate and use infection control measures (e.g., wear mask, isolate, social distance, avoid sharing personal items, clean and disinfect "high touch" surfaces, and frequent handwashing) according to CDC guidelines.   5. The patient or parent/caregiver has the option to accept or refuse  bamlanivimab or casirivimab\imdevimab .  After reviewing this information with the patient, The patient agreed to proceed with receiving the bamlanimivab infusion and will be provided a copy of the Fact sheet prior to receiving the infusion.[name] 08/25/2019 8:12 AM

## 2019-12-29 DIAGNOSIS — Z0100 Encounter for examination of eyes and vision without abnormal findings: Secondary | ICD-10-CM | POA: Diagnosis not present

## 2019-12-29 DIAGNOSIS — H524 Presbyopia: Secondary | ICD-10-CM | POA: Diagnosis not present

## 2019-12-29 DIAGNOSIS — E109 Type 1 diabetes mellitus without complications: Secondary | ICD-10-CM | POA: Diagnosis not present

## 2019-12-29 DIAGNOSIS — Z135 Encounter for screening for eye and ear disorders: Secondary | ICD-10-CM | POA: Diagnosis not present

## 2019-12-29 DIAGNOSIS — E7800 Pure hypercholesterolemia, unspecified: Secondary | ICD-10-CM | POA: Diagnosis not present

## 2020-01-02 DIAGNOSIS — E1129 Type 2 diabetes mellitus with other diabetic kidney complication: Secondary | ICD-10-CM | POA: Diagnosis not present

## 2020-01-02 DIAGNOSIS — R030 Elevated blood-pressure reading, without diagnosis of hypertension: Secondary | ICD-10-CM | POA: Diagnosis not present

## 2020-01-02 DIAGNOSIS — E7800 Pure hypercholesterolemia, unspecified: Secondary | ICD-10-CM | POA: Diagnosis not present

## 2020-01-02 DIAGNOSIS — E1169 Type 2 diabetes mellitus with other specified complication: Secondary | ICD-10-CM | POA: Diagnosis not present

## 2020-01-02 DIAGNOSIS — Z8711 Personal history of peptic ulcer disease: Secondary | ICD-10-CM | POA: Diagnosis not present

## 2020-01-02 DIAGNOSIS — N529 Male erectile dysfunction, unspecified: Secondary | ICD-10-CM | POA: Diagnosis not present

## 2020-01-02 DIAGNOSIS — N183 Chronic kidney disease, stage 3 unspecified: Secondary | ICD-10-CM | POA: Diagnosis not present

## 2020-07-16 DIAGNOSIS — E1169 Type 2 diabetes mellitus with other specified complication: Secondary | ICD-10-CM | POA: Diagnosis not present

## 2020-07-16 DIAGNOSIS — E1129 Type 2 diabetes mellitus with other diabetic kidney complication: Secondary | ICD-10-CM | POA: Diagnosis not present

## 2020-07-16 DIAGNOSIS — Z0000 Encounter for general adult medical examination without abnormal findings: Secondary | ICD-10-CM | POA: Diagnosis not present

## 2020-07-16 DIAGNOSIS — E7800 Pure hypercholesterolemia, unspecified: Secondary | ICD-10-CM | POA: Diagnosis not present

## 2020-07-16 DIAGNOSIS — N529 Male erectile dysfunction, unspecified: Secondary | ICD-10-CM | POA: Diagnosis not present

## 2020-07-16 DIAGNOSIS — N183 Chronic kidney disease, stage 3 unspecified: Secondary | ICD-10-CM | POA: Diagnosis not present

## 2020-07-16 DIAGNOSIS — Z1389 Encounter for screening for other disorder: Secondary | ICD-10-CM | POA: Diagnosis not present

## 2020-07-16 DIAGNOSIS — Z8711 Personal history of peptic ulcer disease: Secondary | ICD-10-CM | POA: Diagnosis not present

## 2020-07-16 DIAGNOSIS — R030 Elevated blood-pressure reading, without diagnosis of hypertension: Secondary | ICD-10-CM | POA: Diagnosis not present

## 2020-08-29 DIAGNOSIS — R0789 Other chest pain: Secondary | ICD-10-CM | POA: Diagnosis not present

## 2020-08-29 DIAGNOSIS — R0609 Other forms of dyspnea: Secondary | ICD-10-CM | POA: Diagnosis not present

## 2020-08-29 DIAGNOSIS — R6881 Early satiety: Secondary | ICD-10-CM | POA: Diagnosis not present

## 2020-08-29 DIAGNOSIS — E1169 Type 2 diabetes mellitus with other specified complication: Secondary | ICD-10-CM | POA: Diagnosis not present

## 2020-10-04 IMAGING — DX DG CHEST 1V PORT
2 series · 2 of 2 positions shown · non-contrast
Comparison: Radiograph November 11, 2015

CLINICAL DATA: Cough, syncope and cough

EXAM:
PORTABLE CHEST 1 VIEW

[chest ap (1 of 2)]
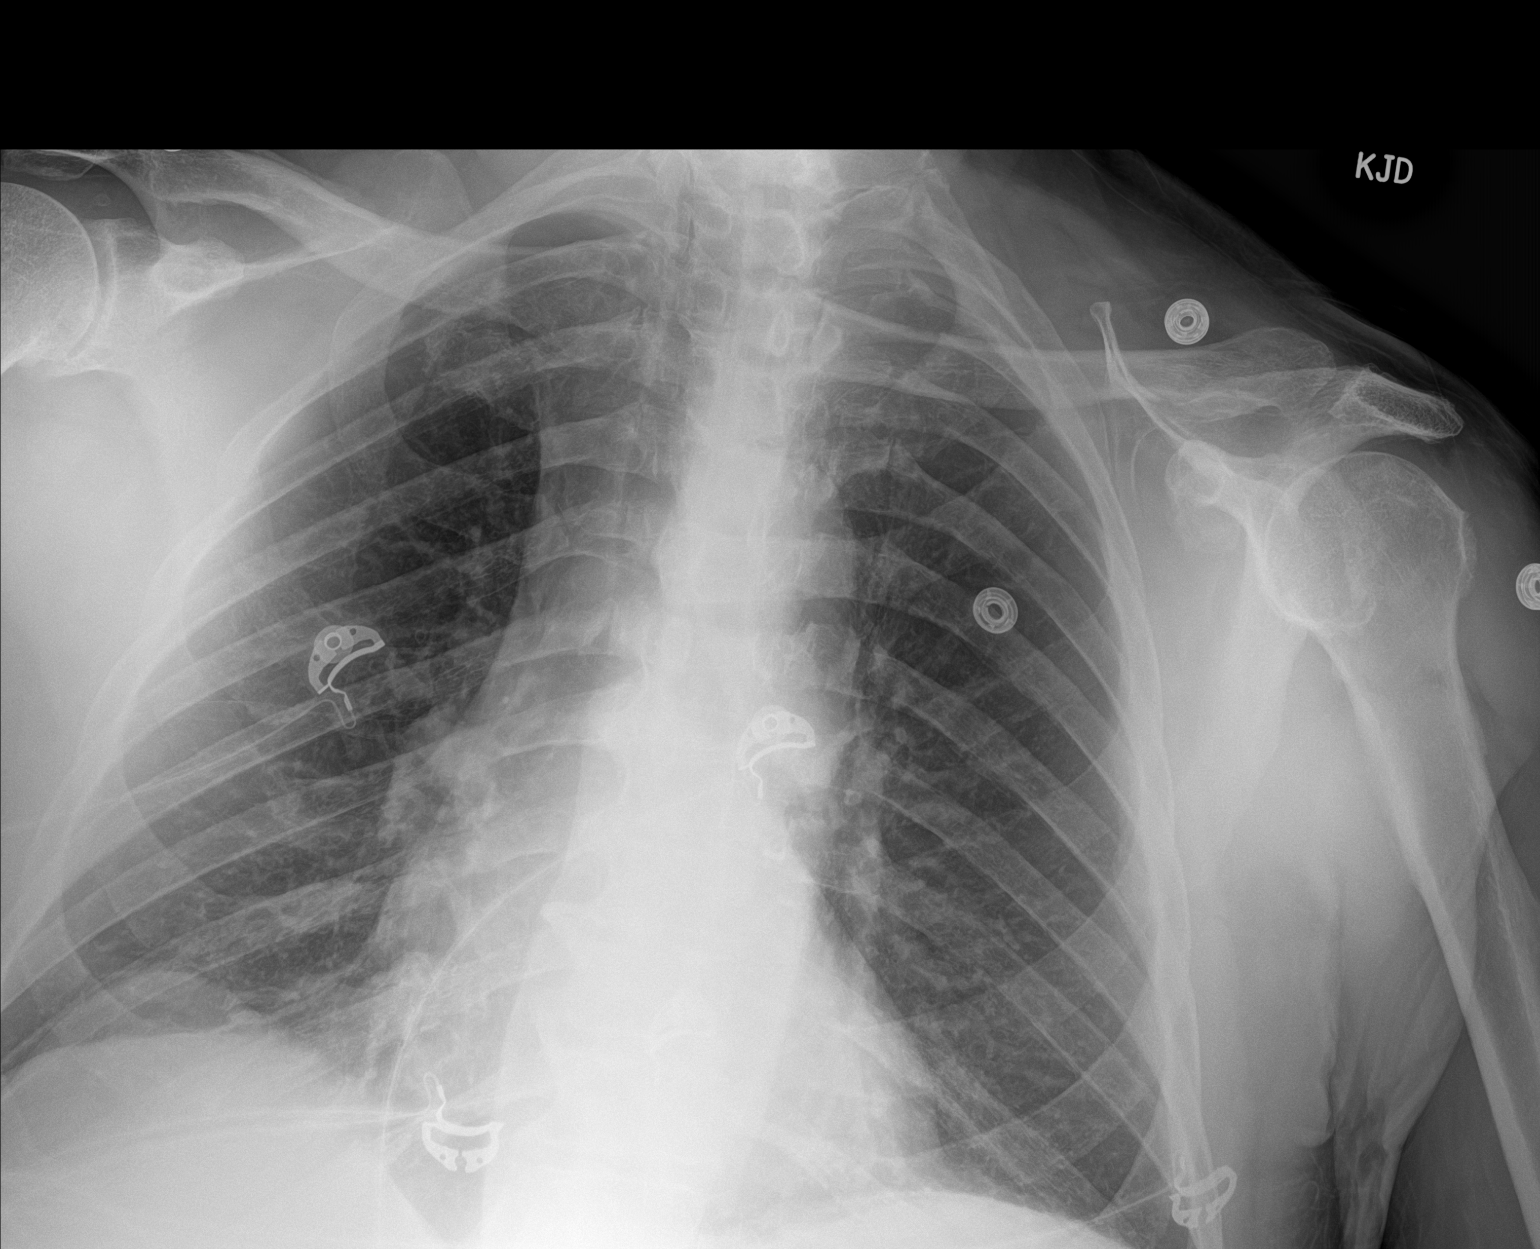

[chest ap (2 of 2)]
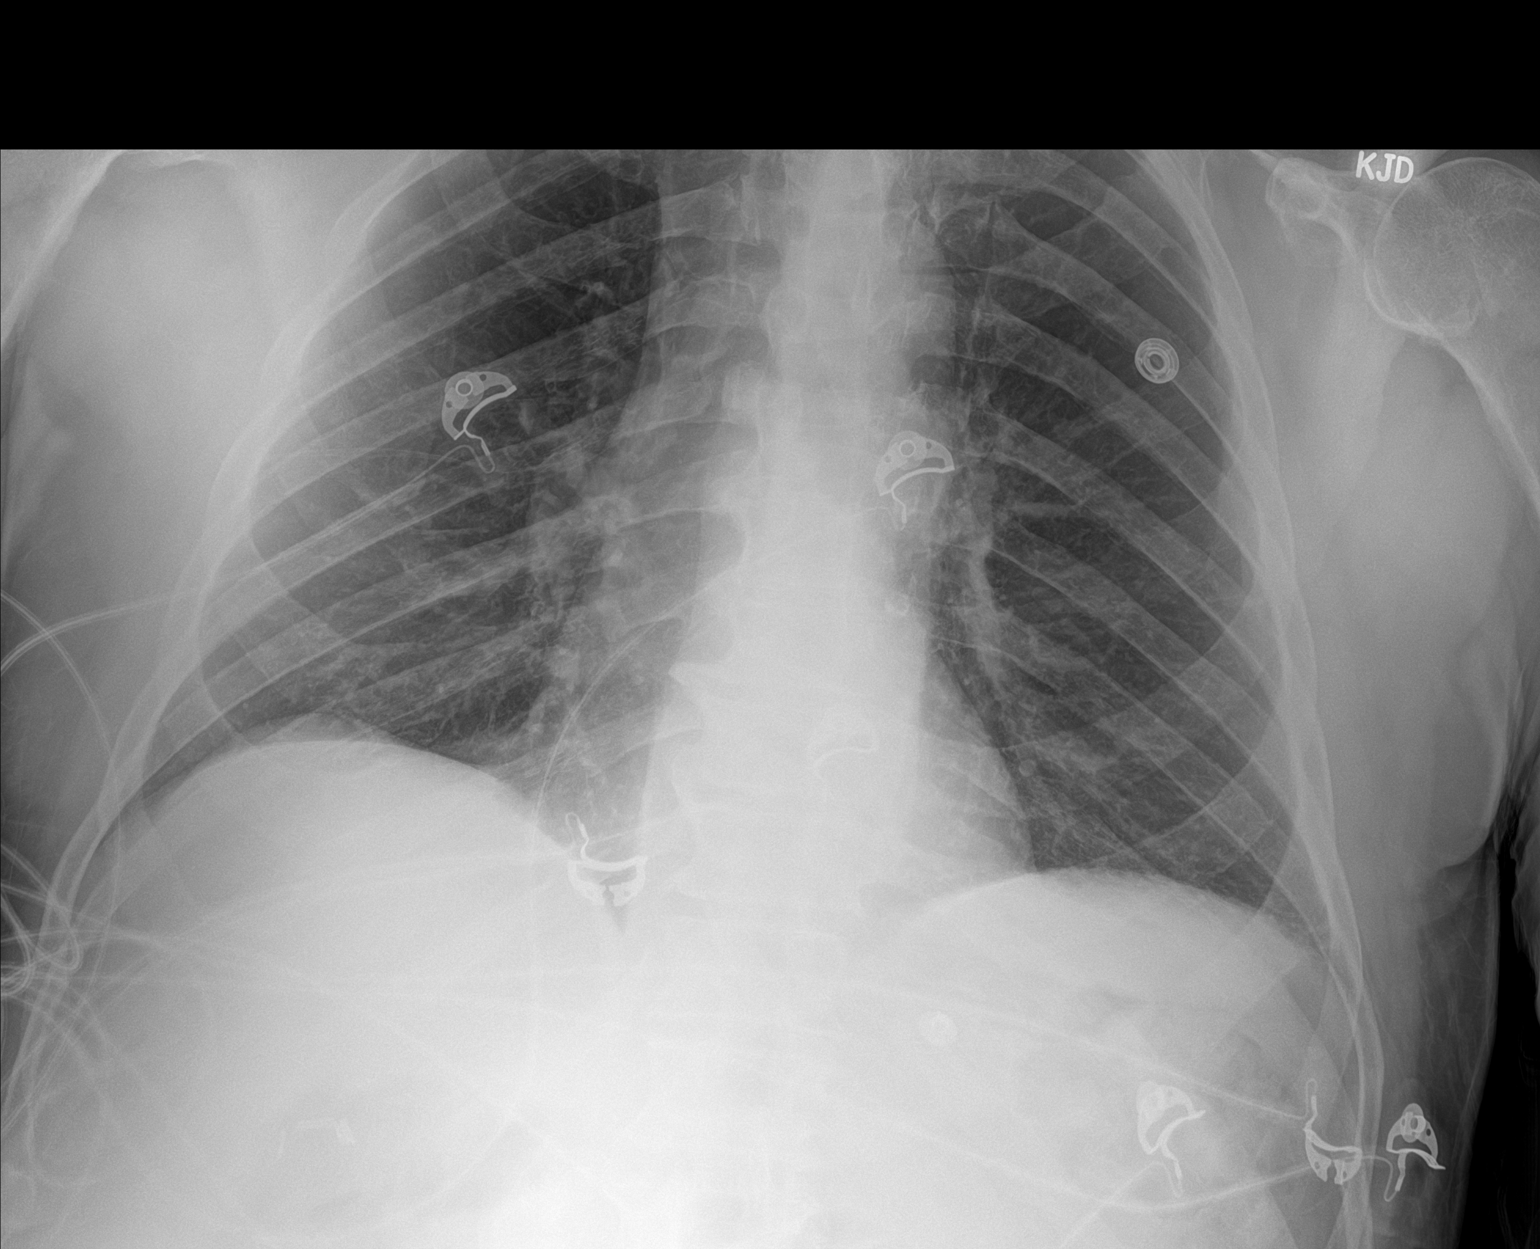

[2 of 2 positions shown; findings below may reference images not displayed]

FINDINGS: Streaky opacities in the bases, likely atelectasis. No
consolidation, features of edema, pneumothorax, or effusion.
Pulmonary vascularity is normally distributed. The cardiomediastinal
contours are unremarkable. No acute osseous or soft tissue
abnormality.
IMPRESSION: Some basilar atelectatic changes. Otherwise no acute cardiopulmonary
abnormality.

## 2020-12-30 DIAGNOSIS — E119 Type 2 diabetes mellitus without complications: Secondary | ICD-10-CM | POA: Diagnosis not present

## 2021-01-08 DIAGNOSIS — Z8711 Personal history of peptic ulcer disease: Secondary | ICD-10-CM | POA: Diagnosis not present

## 2021-01-08 DIAGNOSIS — R030 Elevated blood-pressure reading, without diagnosis of hypertension: Secondary | ICD-10-CM | POA: Diagnosis not present

## 2021-01-08 DIAGNOSIS — Z7984 Long term (current) use of oral hypoglycemic drugs: Secondary | ICD-10-CM | POA: Diagnosis not present

## 2021-01-08 DIAGNOSIS — E1129 Type 2 diabetes mellitus with other diabetic kidney complication: Secondary | ICD-10-CM | POA: Diagnosis not present

## 2021-01-08 DIAGNOSIS — E7800 Pure hypercholesterolemia, unspecified: Secondary | ICD-10-CM | POA: Diagnosis not present

## 2021-01-08 DIAGNOSIS — N183 Chronic kidney disease, stage 3 unspecified: Secondary | ICD-10-CM | POA: Diagnosis not present

## 2021-01-08 DIAGNOSIS — E1169 Type 2 diabetes mellitus with other specified complication: Secondary | ICD-10-CM | POA: Diagnosis not present

## 2021-01-08 DIAGNOSIS — N529 Male erectile dysfunction, unspecified: Secondary | ICD-10-CM | POA: Diagnosis not present

## 2021-01-08 DIAGNOSIS — Z23 Encounter for immunization: Secondary | ICD-10-CM | POA: Diagnosis not present

## 2021-01-16 DIAGNOSIS — E119 Type 2 diabetes mellitus without complications: Secondary | ICD-10-CM | POA: Diagnosis not present

## 2021-01-16 DIAGNOSIS — E113292 Type 2 diabetes mellitus with mild nonproliferative diabetic retinopathy without macular edema, left eye: Secondary | ICD-10-CM | POA: Diagnosis not present

## 2021-01-16 DIAGNOSIS — H25813 Combined forms of age-related cataract, bilateral: Secondary | ICD-10-CM | POA: Diagnosis not present

## 2021-07-18 DIAGNOSIS — Z1389 Encounter for screening for other disorder: Secondary | ICD-10-CM | POA: Diagnosis not present

## 2021-07-18 DIAGNOSIS — Z0000 Encounter for general adult medical examination without abnormal findings: Secondary | ICD-10-CM | POA: Diagnosis not present

## 2021-07-18 DIAGNOSIS — E1129 Type 2 diabetes mellitus with other diabetic kidney complication: Secondary | ICD-10-CM | POA: Diagnosis not present

## 2021-07-18 DIAGNOSIS — E1169 Type 2 diabetes mellitus with other specified complication: Secondary | ICD-10-CM | POA: Diagnosis not present

## 2021-07-18 DIAGNOSIS — N183 Chronic kidney disease, stage 3 unspecified: Secondary | ICD-10-CM | POA: Diagnosis not present

## 2021-07-18 DIAGNOSIS — N529 Male erectile dysfunction, unspecified: Secondary | ICD-10-CM | POA: Diagnosis not present

## 2021-07-18 DIAGNOSIS — Z8711 Personal history of peptic ulcer disease: Secondary | ICD-10-CM | POA: Diagnosis not present

## 2021-07-18 DIAGNOSIS — R030 Elevated blood-pressure reading, without diagnosis of hypertension: Secondary | ICD-10-CM | POA: Diagnosis not present

## 2021-07-18 DIAGNOSIS — E7800 Pure hypercholesterolemia, unspecified: Secondary | ICD-10-CM | POA: Diagnosis not present

## 2021-07-23 DIAGNOSIS — E1169 Type 2 diabetes mellitus with other specified complication: Secondary | ICD-10-CM | POA: Diagnosis not present

## 2021-11-25 ENCOUNTER — Encounter: Payer: Self-pay | Admitting: Gastroenterology

## 2021-12-03 ENCOUNTER — Encounter: Payer: Self-pay | Admitting: Gastroenterology

## 2021-12-16 ENCOUNTER — Ambulatory Visit (AMBULATORY_SURGERY_CENTER): Payer: Self-pay

## 2021-12-16 VITALS — Ht 67.0 in | Wt 214.0 lb

## 2021-12-16 DIAGNOSIS — Z86010 Personal history of colonic polyps: Secondary | ICD-10-CM

## 2021-12-16 MED ORDER — CLENPIQ 10-3.5-12 MG-GM -GM/160ML PO SOLN: 1.0000 | Freq: Once | ORAL | 0 refills | Status: AC

## 2021-12-16 NOTE — Progress Notes (Signed)
No egg or soy allergy known to patient  No issues known to pt with past sedation with any surgeries or procedures Patient denies ever being told they had issues or difficulty with intubation  No FH of Malignant Hyperthermia Pt is not on diet pills Pt is not on  home 02  Pt is not on blood thinners  Pt denies issues with constipation  No A fib or A flutter    NO PA's for preps discussed with pt In PV today  Discussed with pt there will be an out-of-pocket cost for prep and that varies from $0 to 70 +  dollars - pt verbalized understanding  Pt instructed to use Singlecare.com or GoodRx for a price reduction on prep   PV completed IN PERSON   Pt verified name, DOB, address and insurance during PV today.   Pt mailed instruction packet with copy of consent form to read and not return, and instructions.  Pt encouraged to call with questions or issues.  If pt has My chart, procedure instructions sent via My Chart  Insurance confirmed with pt at [facility] today

## 2021-12-30 ENCOUNTER — Encounter: Payer: Self-pay | Admitting: Gastroenterology

## 2022-01-01 ENCOUNTER — Encounter: Payer: Self-pay | Admitting: Gastroenterology

## 2022-01-01 ENCOUNTER — Ambulatory Visit (AMBULATORY_SURGERY_CENTER): Payer: Medicare HMO | Admitting: Gastroenterology

## 2022-01-01 VITALS — BP 99/65 | HR 65 | Temp 96.6°F | Resp 13 | Ht 67.0 in | Wt 214.0 lb

## 2022-01-01 DIAGNOSIS — Z86010 Personal history of colonic polyps: Secondary | ICD-10-CM | POA: Diagnosis not present

## 2022-01-01 DIAGNOSIS — Z800 Family history of malignant neoplasm of digestive organs: Secondary | ICD-10-CM

## 2022-01-01 DIAGNOSIS — Z09 Encounter for follow-up examination after completed treatment for conditions other than malignant neoplasm: Secondary | ICD-10-CM

## 2022-01-01 MED ORDER — SODIUM CHLORIDE 0.9 % IV SOLN: 500.0000 mL | Freq: Once | INTRAVENOUS | Status: DC

## 2022-01-01 NOTE — Progress Notes (Signed)
°  Vs by DT in adm ° °Pt's states no medical or surgical changes since previsit or office visit.  °

## 2022-01-01 NOTE — Op Note (Signed)
Luke Alvarez Patient Name: Luke Alvarez Procedure Date: 01/01/2022 10:03 AM MRN: [id number] Endoscopist: Mallie Mussel L. Loletha Carrow , MD Age: 76 Referring MD:  Date of Birth: 11-Aug-1945 Gender: Male Account #: [id number] Procedure:                Colonoscopy Indications:              Screening in patient at increased risk: Colorectal                            cancer in sister 45 or older, Surveillance:                            Personal history of adenomatous polyps on last                            colonoscopy 5 years ago                           Diminutive TA polyp 10/2016                           patient reports his 64 yo sister recently died from                            colon cancer with no prior screening Medicines:                Monitored Anesthesia Care Procedure:                Pre-Anesthesia Assessment:                           - Prior to the procedure, a History and Physical                            was performed, and patient medications and                            allergies were reviewed. The patient's tolerance of                            previous anesthesia was also reviewed. The risks                            and benefits of the procedure and the sedation                            options and risks were discussed with the patient.                            All questions were answered, and informed consent                            was obtained. Prior Anticoagulants: The patient has  taken no previous anticoagulant or antiplatelet                            agents. ASA Grade Assessment: II - A patient with                            mild systemic disease. After reviewing the risks                            and benefits, the patient was deemed in                            satisfactory condition to undergo the procedure.                           After obtaining informed consent, the colonoscope                            was  passed under direct vision. Throughout the                            procedure, the patient's blood pressure, pulse, and                            oxygen saturations were monitored continuously. The                            CF [id number] #[id number] was introduced through the anus                            and advanced to the the cecum, identified by                            appendiceal orifice and ileocecal valve. The                            colonoscopy was somewhat difficult due to a                            redundant colon. Successful completion of the                            procedure was aided by using manual pressure and                            straightening and shortening the scope to obtain                            bowel loop reduction. The patient tolerated the                            procedure well. The quality of the bowel  preparation was good. The ileocecal valve,                            appendiceal orifice, and rectum were photographed. Scope In: 10:09:47 AM Scope Out: 10:23:13 AM Scope Withdrawal Time: 0 hours 8 minutes 29 seconds  Total Procedure Duration: 0 hours 13 minutes 26 seconds  Findings:                 The perianal and digital rectal examinations were                            normal.                           Many diverticula were found in the left colon and                            right colon.                           The colon (entire examined portion) was redundant.                           Repeat examination of right colon under NBI                            performed.                           There is no endoscopic evidence of polyps in the                            entire colon.                           Retroflexion in the rectum was not performed due to                            anatomy.                           Internal hemorrhoids were found.                           The exam was otherwise  without abnormality. Complications:            No immediate complications. Estimated Blood Loss:     Estimated blood loss: none. Impression:               - Diverticulosis in the left colon and in the right                            colon.                           - Redundant colon.                           -  Internal hemorrhoids.                           - The examination was otherwise normal.                           - No specimens collected. Recommendation:           - Patient has a contact number available for                            emergencies. The signs and symptoms of potential                            delayed complications were discussed with the                            patient. Return to normal activities tomorrow.                            Written discharge instructions were provided to the                            patient.                           - Resume previous diet.                           - Continue present medications.                           - No repeat screening/surveillance colonoscopy due                            to age, current guidelines and today's findings                            (even considering famiy history). Britlyn Martine L. Loletha Carrow, MD 01/01/2022 10:30:12 AM This report has been signed electronically.

## 2022-01-01 NOTE — Patient Instructions (Signed)
  Handouts on diverticulosis and hemorrhoids given.    YOU HAD AN ENDOSCOPIC PROCEDURE TODAY AT [facility]:   Refer to the procedure report that was given to you for any specific questions about what was found during the examination.  If the procedure report does not answer your questions, please call your gastroenterologist to clarify.  If you requested that your care partner not be given the details of your procedure findings, then the procedure report has been included in a sealed envelope for you to review at your convenience later.  YOU SHOULD EXPECT: Some feelings of bloating in the abdomen. Passage of more gas than usual.  Walking can help get rid of the air that was put into your GI tract during the procedure and reduce the bloating. If you had a lower endoscopy (such as a colonoscopy or flexible sigmoidoscopy) you may notice spotting of blood in your stool or on the toilet paper. If you underwent a bowel prep for your procedure, you may not have a normal bowel movement for a few days.  Please Note:  You might notice some irritation and congestion in your nose or some drainage.  This is from the oxygen used during your procedure.  There is no need for concern and it should clear up in a day or so.  SYMPTOMS TO REPORT IMMEDIATELY:  Following lower endoscopy (colonoscopy or flexible sigmoidoscopy):  Excessive amounts of blood in the stool  Significant tenderness or worsening of abdominal pains  Swelling of the abdomen that is new, acute  Fever of 100F or higher    For urgent or emergent issues, a gastroenterologist can be reached at any hour by calling [phone]. Do not use MyChart messaging for urgent concerns.    DIET:  We do recommend a small meal at first, but then you may proceed to your regular diet.  Drink plenty of fluids but you should avoid alcoholic beverages for 24 hours.  ACTIVITY:  You should plan to take it easy for the rest of today and you  should NOT DRIVE or use heavy machinery until tomorrow (because of the sedation medicines used during the test).    FOLLOW UP: Our staff will call the number listed on your records 24-72 hours following your procedure to check on you and address any questions or concerns that you may have regarding the information given to you following your procedure. If we do not reach you, we will leave a message.  We will attempt to reach you two times.  During this call, we will ask if you have developed any symptoms of COVID 19. If you develop any symptoms (ie: fever, flu-like symptoms, shortness of breath, cough etc.) before then, please call [phone].  If you test positive for Covid 19 in the 2 weeks post procedure, please call and report this information to [place].    If any biopsies were taken you will be contacted by phone or by letter within the next 1-3 weeks.  Please call us at [phone] if you have not heard about the biopsies in 3 weeks.    SIGNATURES/CONFIDENTIALITY: You and/or your care partner have signed paperwork which will be entered into your electronic medical record.  These signatures attest to the fact that that the information above on your After Visit Summary has been reviewed and is understood.  Full responsibility of the confidentiality of this discharge information lies with you and/or your care-partner.

## 2022-01-01 NOTE — Progress Notes (Signed)
History and Physical:  This patient presents for endoscopic testing for: Encounter Diagnoses  Name Primary?   Personal history of colonic polyps Yes   Family history of colon cancer     76 year old man here for surveillance colonoscopy.  Last colonoscopy April 2018 had a diminutive tubular adenoma. He also reports that his 46 year old sister recently died from colorectal cancer, and he does not think she had ever been been screened. Patient denies chronic abdominal pain, rectal bleeding, constipation or diarrhea.   Patient is otherwise without complaints or active issues today.   Past Medical History: Past Medical History:  Diagnosis Date   Anxiety    Arthritis    Diabetes mellitus without complication (HCC)    type 2   GERD (gastroesophageal reflux disease)    Hyperlipidemia      Past Surgical History: Past Surgical History:  Procedure Laterality Date   CHOLECYSTECTOMY  2008   COLONOSCOPY  2018   HYDROCELE EXCISION Left    HYDROCELE EXCISION Right 09/01/2017   Procedure: HYDROCELECTOMY ADULT;  Surgeon: Rene Paci, MD;  Location: [facility];  Service: Urology;  Laterality: Right;   SHOULDER SURGERY Left    rotator cuff    Allergies: No Known Allergies  Outpatient Meds: Current Outpatient Medications  Medication Sig Dispense Refill   Ascorbic Acid (VITAMIN C) 1000 MG tablet Take 1,000 mg by mouth daily.     JARDIANCE 25 MG TABS tablet      Multiple Vitamins-Minerals (ALIVE DIABETIC MULTIVITAMIN PO) Take by mouth daily at 8 pm.     omeprazole (PRILOSEC) 20 MG capsule Take 20 mg by mouth daily.     OVER THE COUNTER MEDICATION daily at 8 pm. Anxietie-x  supplement for anxiety     pioglitazone (ACTOS) 30 MG tablet Take 30 mg by mouth every evening.      simvastatin (ZOCOR) 20 MG tablet Take 20 mg by mouth daily at 6 PM.     Vitamin D, Cholecalciferol, 25 MCG (1000 UT) CAPS Take by mouth.     OZEMPIC, 0.25 OR 0.5 MG/DOSE, 2 MG/1.5ML SOPN       tadalafil (CIALIS) 10 MG tablet Take 10 mg by mouth daily as needed for erectile dysfunction.     UNABLE TO FIND Liquid multvitamin 1 per day     Current Facility-Administered Medications  Medication Dose Route Frequency Provider Last Rate Last Admin   0.9 %  sodium chloride infusion  500 mL Intravenous Once Charlie Pitter III, MD          ___________________________________________________________________ Objective   Exam:  BP 132/90   Pulse 86   Temp (!) 96.6 F (35.9 C)   Resp 19   Ht 5\' 7"  (1.702 m)   Wt 214 lb (97.1 kg)   SpO2 98%   BMI 33.52 kg/m   CV: RRR without murmur, S1/S2 Resp: clear to auscultation bilaterally, normal RR and effort noted GI: soft, no tenderness, with active bowel sounds.   Assessment: Encounter Diagnoses  Name Primary?   Personal history of colonic polyps Yes   Family history of colon cancer      Plan: Colonoscopy  The benefits and risks of the planned procedure were described in detail with the patient or (when appropriate) their health care proxy.  Risks were outlined as including, but not limited to, bleeding, infection, perforation, adverse medication reaction leading to cardiac or pulmonary decompensation, pancreatitis (if ERCP).  The limitation of incomplete mucosal visualization was also discussed.  No  guarantees or warranties were given.    The patient is appropriate for an endoscopic procedure in the ambulatory setting.   - Amada Jupiter, MD

## 2022-01-01 NOTE — Progress Notes (Signed)
Occ PAC's noted

## 2022-01-02 ENCOUNTER — Telehealth: Payer: Self-pay

## 2022-01-15 DIAGNOSIS — N529 Male erectile dysfunction, unspecified: Secondary | ICD-10-CM | POA: Diagnosis not present

## 2022-01-15 DIAGNOSIS — E1129 Type 2 diabetes mellitus with other diabetic kidney complication: Secondary | ICD-10-CM | POA: Diagnosis not present

## 2022-01-15 DIAGNOSIS — R030 Elevated blood-pressure reading, without diagnosis of hypertension: Secondary | ICD-10-CM | POA: Diagnosis not present

## 2022-01-15 DIAGNOSIS — Z8711 Personal history of peptic ulcer disease: Secondary | ICD-10-CM | POA: Diagnosis not present

## 2022-01-15 DIAGNOSIS — E1169 Type 2 diabetes mellitus with other specified complication: Secondary | ICD-10-CM | POA: Diagnosis not present

## 2022-01-15 DIAGNOSIS — K5730 Diverticulosis of large intestine without perforation or abscess without bleeding: Secondary | ICD-10-CM | POA: Diagnosis not present

## 2022-01-15 DIAGNOSIS — E7800 Pure hypercholesterolemia, unspecified: Secondary | ICD-10-CM | POA: Diagnosis not present

## 2022-01-15 DIAGNOSIS — N183 Chronic kidney disease, stage 3 unspecified: Secondary | ICD-10-CM | POA: Diagnosis not present

## 2022-08-05 DIAGNOSIS — Z1331 Encounter for screening for depression: Secondary | ICD-10-CM | POA: Diagnosis not present

## 2022-08-05 DIAGNOSIS — Z0000 Encounter for general adult medical examination without abnormal findings: Secondary | ICD-10-CM | POA: Diagnosis not present

## 2022-08-05 DIAGNOSIS — N1831 Chronic kidney disease, stage 3a: Secondary | ICD-10-CM | POA: Diagnosis not present

## 2022-08-05 DIAGNOSIS — Z8711 Personal history of peptic ulcer disease: Secondary | ICD-10-CM | POA: Diagnosis not present

## 2022-08-05 DIAGNOSIS — R030 Elevated blood-pressure reading, without diagnosis of hypertension: Secondary | ICD-10-CM | POA: Diagnosis not present

## 2022-08-05 DIAGNOSIS — E1169 Type 2 diabetes mellitus with other specified complication: Secondary | ICD-10-CM | POA: Diagnosis not present

## 2022-08-05 DIAGNOSIS — N529 Male erectile dysfunction, unspecified: Secondary | ICD-10-CM | POA: Diagnosis not present

## 2022-08-05 DIAGNOSIS — E1129 Type 2 diabetes mellitus with other diabetic kidney complication: Secondary | ICD-10-CM | POA: Diagnosis not present

## 2022-08-05 DIAGNOSIS — E7800 Pure hypercholesterolemia, unspecified: Secondary | ICD-10-CM | POA: Diagnosis not present

## 2022-11-05 ENCOUNTER — Emergency Department (HOSPITAL_BASED_OUTPATIENT_CLINIC_OR_DEPARTMENT_OTHER): Payer: No Typology Code available for payment source | Admitting: Radiology

## 2022-11-05 ENCOUNTER — Emergency Department (HOSPITAL_BASED_OUTPATIENT_CLINIC_OR_DEPARTMENT_OTHER)
Admission: EM | Admit: 2022-11-05 | Discharge: 2022-11-05 | Disposition: A | Discharge status: Home / Self Care | Payer: No Typology Code available for payment source | Attending: Emergency Medicine | Admitting: Emergency Medicine

## 2022-11-05 ENCOUNTER — Other Ambulatory Visit: Payer: Self-pay

## 2022-11-05 ENCOUNTER — Encounter (HOSPITAL_BASED_OUTPATIENT_CLINIC_OR_DEPARTMENT_OTHER): Payer: Self-pay | Admitting: *Deleted

## 2022-11-05 DIAGNOSIS — E119 Type 2 diabetes mellitus without complications: Secondary | ICD-10-CM | POA: Insufficient documentation

## 2022-11-05 DIAGNOSIS — R509 Fever, unspecified: Secondary | ICD-10-CM | POA: Diagnosis not present

## 2022-11-05 DIAGNOSIS — J181 Lobar pneumonia, unspecified organism: Secondary | ICD-10-CM | POA: Diagnosis not present

## 2022-11-05 DIAGNOSIS — Z1152 Encounter for screening for COVID-19: Secondary | ICD-10-CM | POA: Insufficient documentation

## 2022-11-05 DIAGNOSIS — J189 Pneumonia, unspecified organism: Secondary | ICD-10-CM

## 2022-11-05 LAB — URINALYSIS, ROUTINE W REFLEX MICROSCOPIC
Bacteria, UA: NONE SEEN
Bilirubin Urine: NEGATIVE
Glucose, UA: >1000 mg/dL — AB
Ketones, ur: NEGATIVE mg/dL
Leukocytes,Ua: NEGATIVE
Nitrite: NEGATIVE
Protein, ur: 30 mg/dL — AB
Specific Gravity, Urine: 1.028 (ref 1.005–1.030)
pH: 6 (ref 5.0–8.0)

## 2022-11-05 LAB — CBC WITH DIFFERENTIAL/PLATELET
Abs Immature Granulocytes: 0.03 10*3/uL (ref 0.00–0.07)
Basophils Absolute: 0 10*3/uL (ref 0.0–0.1)
Basophils Relative: 0 %
Eosinophils Absolute: 0 10*3/uL (ref 0.0–0.5)
Eosinophils Relative: 0 %
HCT: 45.8 % (ref 39.0–52.0)
Hemoglobin: 15.2 g/dL (ref 13.0–17.0)
Immature Granulocytes: 0 %
Lymphocytes Relative: 16 %
Lymphs Abs: 1.2 10*3/uL (ref 0.7–4.0)
MCH: 29.4 pg (ref 26.0–34.0)
MCHC: 33.2 g/dL (ref 30.0–36.0)
MCV: 88.6 fL (ref 80.0–100.0)
Monocytes Absolute: 1.1 10*3/uL — ABNORMAL HIGH (ref 0.1–1.0)
Monocytes Relative: 14 %
Neutro Abs: 5.5 10*3/uL (ref 1.7–7.7)
Neutrophils Relative %: 70 %
Platelets: 202 10*3/uL (ref 150–400)
RBC: 5.17 MIL/uL (ref 4.22–5.81)
RDW: 12.7 % (ref 11.5–15.5)
WBC: 7.9 10*3/uL (ref 4.0–10.5)
nRBC: 0 % (ref 0.0–0.2)

## 2022-11-05 LAB — COMPREHENSIVE METABOLIC PANEL
ALT: 111 U/L — ABNORMAL HIGH (ref 0–44)
AST: 79 U/L — ABNORMAL HIGH (ref 15–41)
Albumin: 3.9 g/dL (ref 3.5–5.0)
Alkaline Phosphatase: 61 U/L (ref 38–126)
Anion gap: 8 (ref 5–15)
BUN: 15 mg/dL (ref 8–23)
CO2: 28 mmol/L (ref 22–32)
Calcium: 9.1 mg/dL (ref 8.9–10.3)
Chloride: 104 mmol/L (ref 98–111)
Creatinine, Ser: 1.5 mg/dL — ABNORMAL HIGH (ref 0.61–1.24)
GFR, Estimated: 48 mL/min — ABNORMAL LOW (ref >60)
Glucose, Bld: 85 mg/dL (ref 70–99)
Potassium: 4.4 mmol/L (ref 3.5–5.1)
Sodium: 140 mmol/L (ref 135–145)
Total Bilirubin: 0.6 mg/dL (ref 0.3–1.2)
Total Protein: 7.1 g/dL (ref 6.5–8.1)

## 2022-11-05 LAB — SARS CORONAVIRUS 2 BY RT PCR: SARS Coronavirus 2 by RT PCR: NEGATIVE

## 2022-11-05 LAB — LACTIC ACID, PLASMA: Lactic Acid, Venous: 0.9 mmol/L (ref 0.5–1.9)

## 2022-11-05 MED ORDER — ACETAMINOPHEN 325 MG PO TABS
650.0000 mg | ORAL_TABLET | Freq: Once | ORAL | Status: AC
  Administered 2022-11-05: 650 mg via ORAL
  Filled 2022-11-05: qty 2

## 2022-11-05 MED ORDER — DOXYCYCLINE HYCLATE 100 MG PO CAPS: 100.0000 mg | ORAL_CAPSULE | Freq: Two times a day (BID) | ORAL | 0 refills | Status: AC

## 2022-11-05 NOTE — ED Notes (Signed)
Provider requested to hold on blood work until they could talk/see them.Marland Kitchen

## 2022-11-05 NOTE — ED Notes (Signed)
Discharge paperwork given and verbally understood. 

## 2022-11-05 NOTE — Discharge Instructions (Addendum)
You were given a course of antibiotics on today's visit in order to help treat you for your pneumonia.  Please take 1 tablet today for the next 7 days to help with your symptoms.  You will need to see your primary care physician after you have completed this antibiotics to obtain a repeat x-ray to make sure the pneumonia has cleared up.  If you experience any worsening symptoms please return to the emergency department.

## 2022-11-05 NOTE — ED Notes (Signed)
Pt is changing into gown.  Warm blankets given

## 2022-11-05 NOTE — ED Provider Notes (Signed)
[place] EMERGENCY DEPARTMENT AT [facility] Provider Note   CSN: [id number] Arrival date & time: 11/05/22  4098     History  Chief Complaint  Patient presents with   Fever    Luke Alvarez is a 77 y.o. male.  77 year old male with a past medical history DM, Anxiety presents to the ED with a chief complaint of fever that has been ongoing for the past 5 days.  Reports that symptoms began with a low-grade temperature, yesterday he began to spike a higher temperature with a Tmax of 102, took TheraFlu although he does not have any upper respiratory symptoms and reports his fever went back down.  He has had very little energy, very little appetite.  He reports he has been hydrating however he feels like this has not been enough.  He has not had any urinary symptoms.  Has been taking his medication as prescribed.  No sick contacts, no abdominal pain or chest pain, no shortness of breath.      The history is provided by the patient and medical records.  Fever Associated symptoms: myalgias and nausea   Associated symptoms: no chest pain, no headaches, no sore throat and no vomiting        Home Medications Prior to Admission medications   Medication Sig Start Date End Date Taking? Authorizing Provider  doxycycline (VIBRAMYCIN) 100 MG capsule Take 1 capsule (100 mg total) by mouth 2 (two) times daily for 7 days. 11/05/22 11/12/22 Yes Jaymes Hang, Leonie Douglas, PA-C  Ascorbic Acid (VITAMIN C) 1000 MG tablet Take 1,000 mg by mouth daily.    [provider]  JARDIANCE 25 MG TABS tablet  07/18/21   [provider]  Multiple Vitamins-Minerals (ALIVE DIABETIC MULTIVITAMIN PO) Take by mouth daily at 8 pm.    [provider]  omeprazole (PRILOSEC) 20 MG capsule Take 20 mg by mouth daily.    [provider]  OVER THE COUNTER MEDICATION daily at 8 pm. Anxietie-x  supplement for anxiety    [provider]  OZEMPIC, 0.25 OR 0.5 MG/DOSE, 2 MG/1.5ML SOPN  07/28/21    [provider]  pioglitazone (ACTOS) 30 MG tablet Take 30 mg by mouth every evening.     [provider]  simvastatin (ZOCOR) 20 MG tablet Take 20 mg by mouth daily at 6 PM.    [provider]  tadalafil (CIALIS) 10 MG tablet Take 10 mg by mouth daily as needed for erectile dysfunction.    [provider]  UNABLE TO FIND Liquid multvitamin 1 per day    [provider]  Vitamin D, Cholecalciferol, 25 MCG (1000 UT) CAPS Take by mouth.    [provider]      Allergies    Patient has no known allergies.    Review of Systems   Review of Systems  Constitutional:  Positive for fever.  HENT:  Negative for sore throat.   Respiratory:  Negative for shortness of breath.   Cardiovascular:  Negative for chest pain.  Gastrointestinal:  Positive for nausea. Negative for abdominal pain and vomiting.  Genitourinary:  Negative for difficulty urinating and flank pain.  Musculoskeletal:  Positive for myalgias. Negative for back pain.  Neurological:  Negative for headaches.  All other systems reviewed and are negative.   Physical Exam Updated Vital Signs BP (!) 142/90   Pulse 87   Temp 99.7 F (37.6 C) (Oral)   Resp 16   SpO2 94%  Physical Exam Vitals and nursing  note reviewed.  Constitutional:      Appearance: He is well-developed.  HENT:     Head: Normocephalic and atraumatic.  Eyes:     General: No scleral icterus.    Pupils: Pupils are equal, round, and reactive to light.  Cardiovascular:     Heart sounds: Normal heart sounds.  Pulmonary:     Effort: Pulmonary effort is normal.     Breath sounds: Rales present. No wheezing.     Comments: Lungs are diminished to auscultation, with some rales present. Chest:     Chest wall: No tenderness.  Abdominal:     General: Bowel sounds are normal. There is no distension.     Palpations: Abdomen is soft.     Tenderness: There is no abdominal tenderness.  Musculoskeletal:        General:  No tenderness or deformity.     Cervical back: Normal range of motion.  Skin:    General: Skin is warm and dry.  Neurological:     Mental Status: He is alert and oriented to person, place, and time.     ED Results / Procedures / Treatments   Labs (all labs ordered are listed, but only abnormal results are displayed) Labs Reviewed  COMPREHENSIVE METABOLIC PANEL - Abnormal; Notable for the following components:      Result Value   Creatinine, Ser 1.50 (*)    AST 79 (*)    ALT 111 (*)    GFR, Estimated 48 (*)    All other components within normal limits  CBC WITH DIFFERENTIAL/PLATELET - Abnormal; Notable for the following components:   Monocytes Absolute 1.1 (*)    All other components within normal limits  URINALYSIS, ROUTINE W REFLEX MICROSCOPIC - Abnormal; Notable for the following components:   Glucose, UA >1,000 (*)    Hgb urine dipstick TRACE (*)    Protein, ur 30 (*)    All other components within normal limits  SARS CORONAVIRUS 2 BY RT PCR  LACTIC ACID, PLASMA    EKG None  Radiology DG Chest 2 View  Result Date: 11/05/2022 CLINICAL DATA:  Fever and body ache. EXAM: CHEST - 2 VIEW COMPARISON:  X-ray 08/25/2019 FINDINGS: Developing parenchymal infiltrate along the inferior aspect of the right upper lobe. Possible pneumonia. Recommend follow-up. No pneumothorax, effusion or edema. Normal cardiopericardial silhouette. Eventration of the right hemidiaphragm. Diffuse syndesmophytes along the thoracic spine on the lateral view IMPRESSION: Developing parenchymal infiltrate along the inferior aspect of the right upper lobe. Possible pneumonia. Recommend follow-up to confirm clearance. No associated effusion Electronically Signed   By: Karen Kays M.D.   On: 11/05/2022 10:48    Procedures Procedures    Medications Ordered in ED Medications  acetaminophen (TYLENOL) tablet 650 mg (has no administration in time range)    ED Course/ Medical Decision Making/ A&P                              Medical Decision Making Amount and/or Complexity of Data Reviewed Labs: ordered. Radiology: ordered.  Risk OTC drugs.   This patient presents to the ED for concern of fever, this involves a number of treatment options, and is a complaint that carries with it a high risk of complications and morbidity.  The differential diagnosis includes sepsis, uti versus URI.    Co morbidities: Discussed in HPI   Brief History:  See HPI.   EMR reviewed including pt PMHx, past surgical  history and past visits to ER.   See HPI for more details   Lab Tests:  I ordered and independently interpreted labs.  The pertinent results include:    I personally reviewed all laboratory work and imaging. Metabolic panel without any acute abnormality specifically kidney function within normal limits and no significant electrolyte abnormalities. CBC without leukocytosis or significant anemia.   Imaging Studies:  Xray of the chest: Developing parenchymal infiltrate along the inferior aspect of the  right upper lobe. Possible pneumonia. Recommend follow-up to confirm  clearance. No associated effusion    Cardiac Monitoring:  NA NA  Medicines ordered:  I ordered medication including tylenol  for pyrexia Reevaluation of the patient after these medicines showed that the patient improved I have reviewed the patients home medicines and have made adjustments as needed  Reevaluation:  After the interventions noted above I re-evaluated patient and found that they have :improved   Social Determinants of Health:  The patient's social determinants of health were a factor in the care of this patient  Problem List / ED Course:  Patient presents to the ED with decrease in energy, fatigue, fevers at home however arrived afebrile but upon rechecking his temperature it went up to 99.7, no nausea, no vomiting but reports he feels like he does not have any energy.  Tested negative for  COVID-19 here.  Some suspicion for GU versus URI.  However has had a cough but no other symptoms, symptoms did improve with TheraFlu however they return after the medication wore off.  He arrived to the ED otherwise hemodynamically stable satting at 100% on room air without any signs of respiratory distress or tachypnea.  His labs are unremarkable.  His lactic acid is negative.  Not having any nausea or vomiting.  Urinalysis without any signs of infection.  X-ray does show some concern for pneumonia, he is somewhat diminished on his exam, we discussed appropriate therapy with Doxy to treat him for CAP, he will need to be reevaluated by primary care in about a week for repeating of x-ray.  He is otherwise in stable condition without any signs of hypoxia stable for discharge.   Dispostion:  After consideration of the diagnostic results and the patients response to treatment, I feel that the patent would benefit from treatment of his CAP with a short course of doxycycline.    Portions of this note were generated with Scientist, clinical (histocompatibility and immunogenetics). Dictation errors may occur despite best attempts at proofreading.   Final Clinical Impression(s) / ED Diagnoses Final diagnoses:  Community acquired pneumonia of right upper lobe of lung    Rx / DC Orders ED Discharge Orders          Ordered    doxycycline (VIBRAMYCIN) 100 MG capsule  2 times daily        11/05/22 1241              Claude Manges, PA-C 11/05/22 1244    Margarita Grizzle, MD 11/05/22 1659

## 2022-11-05 NOTE — ED Triage Notes (Signed)
Pt is here due to feeling unwell.  He reports fever up to 102 at home, fatigue and body aches and lack of appetite.  No URI, no GU symtoms

## 2022-11-12 DIAGNOSIS — R7401 Elevation of levels of liver transaminase levels: Secondary | ICD-10-CM | POA: Diagnosis not present

## 2022-11-12 DIAGNOSIS — E119 Type 2 diabetes mellitus without complications: Secondary | ICD-10-CM | POA: Diagnosis not present

## 2022-11-12 DIAGNOSIS — J189 Pneumonia, unspecified organism: Secondary | ICD-10-CM | POA: Diagnosis not present

## 2022-12-01 ENCOUNTER — Other Ambulatory Visit: Payer: Self-pay | Admitting: Family Medicine

## 2022-12-01 ENCOUNTER — Ambulatory Visit
Admission: RE | Admit: 2022-12-01 | Discharge: 2022-12-01 | Disposition: A | Discharge status: Home / Self Care | Payer: Medicare HMO | Source: Ambulatory Visit | Attending: Family Medicine | Admitting: Family Medicine

## 2022-12-01 DIAGNOSIS — Z8701 Personal history of pneumonia (recurrent): Secondary | ICD-10-CM

## 2022-12-01 DIAGNOSIS — J189 Pneumonia, unspecified organism: Secondary | ICD-10-CM | POA: Diagnosis not present

## 2022-12-01 DIAGNOSIS — Z09 Encounter for follow-up examination after completed treatment for conditions other than malignant neoplasm: Secondary | ICD-10-CM | POA: Diagnosis not present

## 2023-02-05 DIAGNOSIS — E7800 Pure hypercholesterolemia, unspecified: Secondary | ICD-10-CM | POA: Diagnosis not present

## 2023-02-05 DIAGNOSIS — E1122 Type 2 diabetes mellitus with diabetic chronic kidney disease: Secondary | ICD-10-CM | POA: Diagnosis not present

## 2023-02-05 DIAGNOSIS — R030 Elevated blood-pressure reading, without diagnosis of hypertension: Secondary | ICD-10-CM | POA: Diagnosis not present

## 2023-02-05 DIAGNOSIS — N1831 Chronic kidney disease, stage 3a: Secondary | ICD-10-CM | POA: Diagnosis not present

## 2023-02-05 DIAGNOSIS — Z8711 Personal history of peptic ulcer disease: Secondary | ICD-10-CM | POA: Diagnosis not present

## 2023-02-05 DIAGNOSIS — N529 Male erectile dysfunction, unspecified: Secondary | ICD-10-CM | POA: Diagnosis not present

## 2023-02-05 DIAGNOSIS — E1169 Type 2 diabetes mellitus with other specified complication: Secondary | ICD-10-CM | POA: Diagnosis not present

## 2024-04-11 DIAGNOSIS — N1831 Chronic kidney disease, stage 3a: Secondary | ICD-10-CM | POA: Diagnosis not present

## 2024-04-11 DIAGNOSIS — E7800 Pure hypercholesterolemia, unspecified: Secondary | ICD-10-CM | POA: Diagnosis not present

## 2024-04-11 DIAGNOSIS — J189 Pneumonia, unspecified organism: Secondary | ICD-10-CM | POA: Diagnosis not present

## 2024-04-11 DIAGNOSIS — E118 Type 2 diabetes mellitus with unspecified complications: Secondary | ICD-10-CM | POA: Diagnosis not present

## 2024-05-12 DIAGNOSIS — E118 Type 2 diabetes mellitus with unspecified complications: Secondary | ICD-10-CM | POA: Diagnosis not present

## 2024-05-12 DIAGNOSIS — E7800 Pure hypercholesterolemia, unspecified: Secondary | ICD-10-CM | POA: Diagnosis not present

## 2024-05-12 DIAGNOSIS — I10 Essential (primary) hypertension: Secondary | ICD-10-CM | POA: Diagnosis not present

## 2024-05-12 DIAGNOSIS — H35033 Hypertensive retinopathy, bilateral: Secondary | ICD-10-CM | POA: Diagnosis not present

## 2024-05-12 DIAGNOSIS — E1169 Type 2 diabetes mellitus with other specified complication: Secondary | ICD-10-CM | POA: Diagnosis not present

## 2024-05-12 DIAGNOSIS — N1831 Chronic kidney disease, stage 3a: Secondary | ICD-10-CM | POA: Diagnosis not present

## 2024-05-12 DIAGNOSIS — J189 Pneumonia, unspecified organism: Secondary | ICD-10-CM | POA: Diagnosis not present

## 2024-06-11 DIAGNOSIS — E118 Type 2 diabetes mellitus with unspecified complications: Secondary | ICD-10-CM | POA: Diagnosis not present

## 2024-06-11 DIAGNOSIS — N1831 Chronic kidney disease, stage 3a: Secondary | ICD-10-CM | POA: Diagnosis not present

## 2024-06-11 DIAGNOSIS — E7800 Pure hypercholesterolemia, unspecified: Secondary | ICD-10-CM | POA: Diagnosis not present

## 2024-06-11 DIAGNOSIS — J189 Pneumonia, unspecified organism: Secondary | ICD-10-CM | POA: Diagnosis not present
